# Patient Record
Sex: Female | Born: 1975 | Race: White | Hispanic: No | Marital: Married | Smoking: Former smoker
Health system: Southern US, Community
[De-identification: ages and names within clinical notes are randomized; demographics above are authoritative.]

## PROBLEM LIST (undated history)

## (undated) DIAGNOSIS — K589 Irritable bowel syndrome without diarrhea: Secondary | ICD-10-CM

## (undated) DIAGNOSIS — E785 Hyperlipidemia, unspecified: Secondary | ICD-10-CM

## (undated) DIAGNOSIS — N809 Endometriosis, unspecified: Secondary | ICD-10-CM

## (undated) HISTORY — DX: Hyperlipidemia, unspecified: E78.5

## (undated) HISTORY — DX: Irritable bowel syndrome, unspecified: K58.9

## (undated) HISTORY — PX: INCONTINENCE SURGERY: SHX676

## (undated) HISTORY — PX: ABDOMINAL HYSTERECTOMY: SHX81

---

## 2011-11-16 ENCOUNTER — Emergency Department (INDEPENDENT_AMBULATORY_CARE_PROVIDER_SITE_OTHER)
Admission: EM | Admit: 2011-11-16 | Discharge: 2011-11-16 | Disposition: A | Discharge status: Home / Self Care | Payer: Managed Care, Other (non HMO) | Source: Home / Self Care | Attending: Emergency Medicine | Admitting: Emergency Medicine

## 2011-11-16 ENCOUNTER — Emergency Department
Admit: 2011-11-16 | Discharge: 2011-11-16 | Disposition: A | Discharge status: Home / Self Care | Payer: Managed Care, Other (non HMO) | Attending: Emergency Medicine | Admitting: Emergency Medicine

## 2011-11-16 DIAGNOSIS — M25539 Pain in unspecified wrist: Secondary | ICD-10-CM

## 2011-11-16 DIAGNOSIS — M25531 Pain in right wrist: Secondary | ICD-10-CM

## 2011-11-16 MED ORDER — TRAMADOL HCL 50 MG PO TABS: 50.0000 mg | ORAL_TABLET | Freq: Four times a day (QID) | ORAL | Status: AC | PRN

## 2011-11-16 NOTE — ED Provider Notes (Signed)
History     CSN: [id number]  Arrival date & time 11/16/11  1645   First MD Initiated Contact with Patient 11/16/11 1703      Chief Complaint  Patient presents with  . Wrist Pain    2 weeks    (Consider location/radiation/quality/duration/timing/severity/associated sxs/prior treatment) HPI This is a left-handed female who presents with right wrist pain for the last few weeks. She denies any recent injury or history of injury but states that she has been diagnosed with possible carpal tunnel syndrome in the past. She states that she did fall while ice-skating about 3-4 months ago and may have caught herself with her wrist but does not think that she was injured during this episode. Resting at makes it feel better and she has tried a brace which helps. She's also been trying some over-the-counter ibuprofen which helps slightly. When she tries to manipulate items or pick them up or pull things the pain does get fairly severe and constant. The pain is located on the ulnar aspect of her No past medical history on file.  No past surgical history on file.  Family History  Problem Relation Age of Onset  . Cancer Father     Lung and Kidney  . Heart failure Father     History  Substance Use Topics  . Smoking status: Current Everyday Smoker -- 1.0 packs/day for 10 years  . Smokeless tobacco: Never Used  . Alcohol Use: No    OB History    Grav Para Term Preterm Abortions TAB SAB Ect Mult Living                  Review of Systems  Allergies  Biaxin; Erythromycin; Penicillins; and Zithromax  Home Medications   Current Outpatient Rx  Name Route Sig Dispense Refill  . IBUPROFEN 200 MG PO TABS Oral Take 200 mg by mouth every 6 (six) hours as needed.    Marland Kitchen TRAMADOL HCL 50 MG PO TABS Oral Take 1 tablet (50 mg total) by mouth every 6 (six) hours as needed for pain. 24 tablet 0    BP 111/76  Pulse 94  Temp(Src) 98.1 F (36.7 C) (Oral)  Ht 5' 3.5" (1.613 m)  Wt 126 lb (57.153 kg)   BMI 21.97 kg/m2  SpO2 100%  Physical Exam  Nursing note and vitals reviewed. Constitutional: She is oriented to person, place, and time. She appears well-developed and well-nourished.  HENT:  Head: Normocephalic and atraumatic.  Eyes: No scleral icterus.  Neck: Neck supple.  Cardiovascular: Regular rhythm and normal heart sounds.   Pulmonary/Chest: Effort normal and breath sounds normal. No respiratory distress.  Musculoskeletal:       Right wrist examination demonstrates full range of motion with flexion, extension, supination and pronation. She does have some tenderness to round hypo-thenar eminence as well as in the area of the triangular fibrocartilage complex of the wrist. There is no bruising or swelling. Distal neurovascular status intact. I do not feel any pulsations around the Guyon's canal. The Allen test is negative. The piano key sign does cause some irritation. There is no snuffbox tenderness.  Neurological: She is alert and oriented to person, place, and time.  Skin: Skin is warm and dry.  Psychiatric: She has a normal mood and affect. Her speech is normal.    ED Course  Procedures (including critical care time)  Labs Reviewed - No data to display Dg Wrist Complete Right  11/16/2011  *RADIOLOGY REPORT*  Clinical Data: Ulnar  sided wrist pain.  No known injury.  RIGHT WRIST - COMPLETE 3+ VIEW  Comparison:  None.  Findings:  There is no evidence of fracture or dislocation.  There is no evidence of arthropathy or other focal bone abnormality. Soft tissues are unremarkable.  IMPRESSION: Negative.  Original Report Authenticated By: Danae Orleans, M.D.     1. Right wrist pain       MDM   A. x-ray was ordered and read by the radiologist as above. Despite the read, I am concerned that there may be a very small crack at the triquetrum seen especially on the oblique view. I have let the patient knows that as read normal by the radiology, however it is not improving in the next  7-10 days, then she will need to see a sports medicine doctor who can look further at the x-ray consider advanced imaging. Encourage rest, ice, compression with ACE bandage and/or a brace, and elevation of injured body part.  We have placed her in a wrist splint to allow her to rest her wrist for the next one to 2 weeks. I've also given her prescription for tramadol and advised her to use ice. If she is not improving in the next 7-10 days, I would like her to followup with a sports medicine provider to consider further workup versus MRI versus occupational therapy versus injection.  Lily Kocher, MD 11/16/11 [phone]

## 2011-11-16 NOTE — ED Notes (Signed)
Patient presents with right wrist pain for 2 weeks. She denies recent injury, she did fall ice skating 4-5 months ago and caught herself with her hand. She describes the pain as shooting and tender. Rest makes the pain better. Pain is 10/10 when she pulls or picks up items. She tried otc ibuprofen with little relief.

## 2012-06-29 DIAGNOSIS — R509 Fever, unspecified: Secondary | ICD-10-CM | POA: Insufficient documentation

## 2012-06-29 DIAGNOSIS — J159 Unspecified bacterial pneumonia: Secondary | ICD-10-CM | POA: Insufficient documentation

## 2013-01-21 IMAGING — CR DG WRIST COMPLETE 3+V*R*
4 series · 4 of 4 positions shown · non-contrast
Comparison: None.

CLINICAL DATA: Ulnar sided wrist pain.  No known injury.

RIGHT WRIST - COMPLETE 3+ VIEW

[view not recorded (1 of 4)]
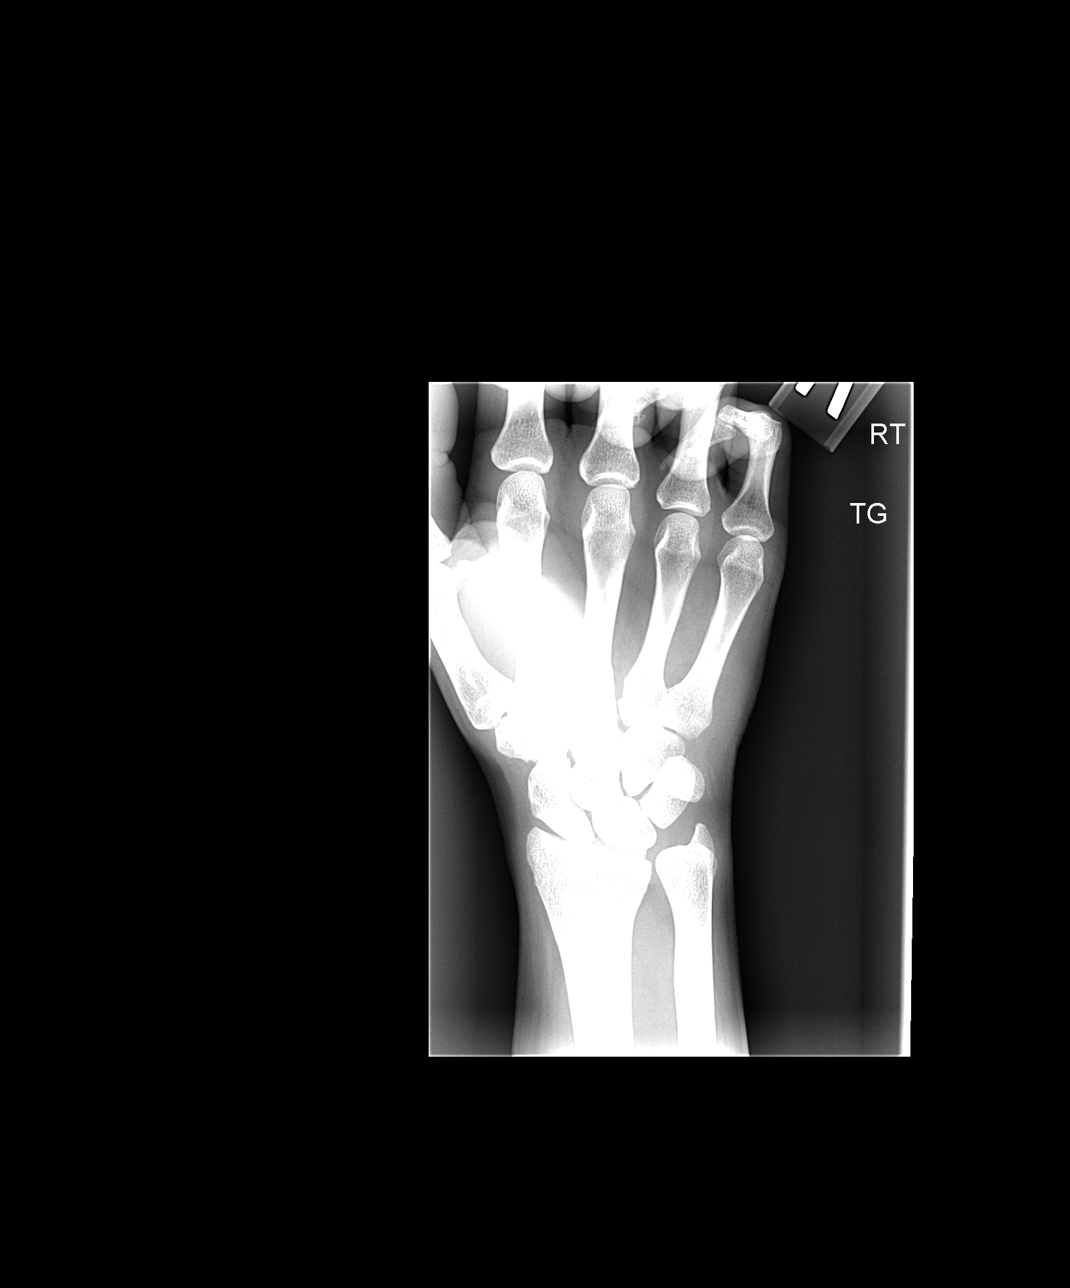

[view not recorded (2 of 4)]
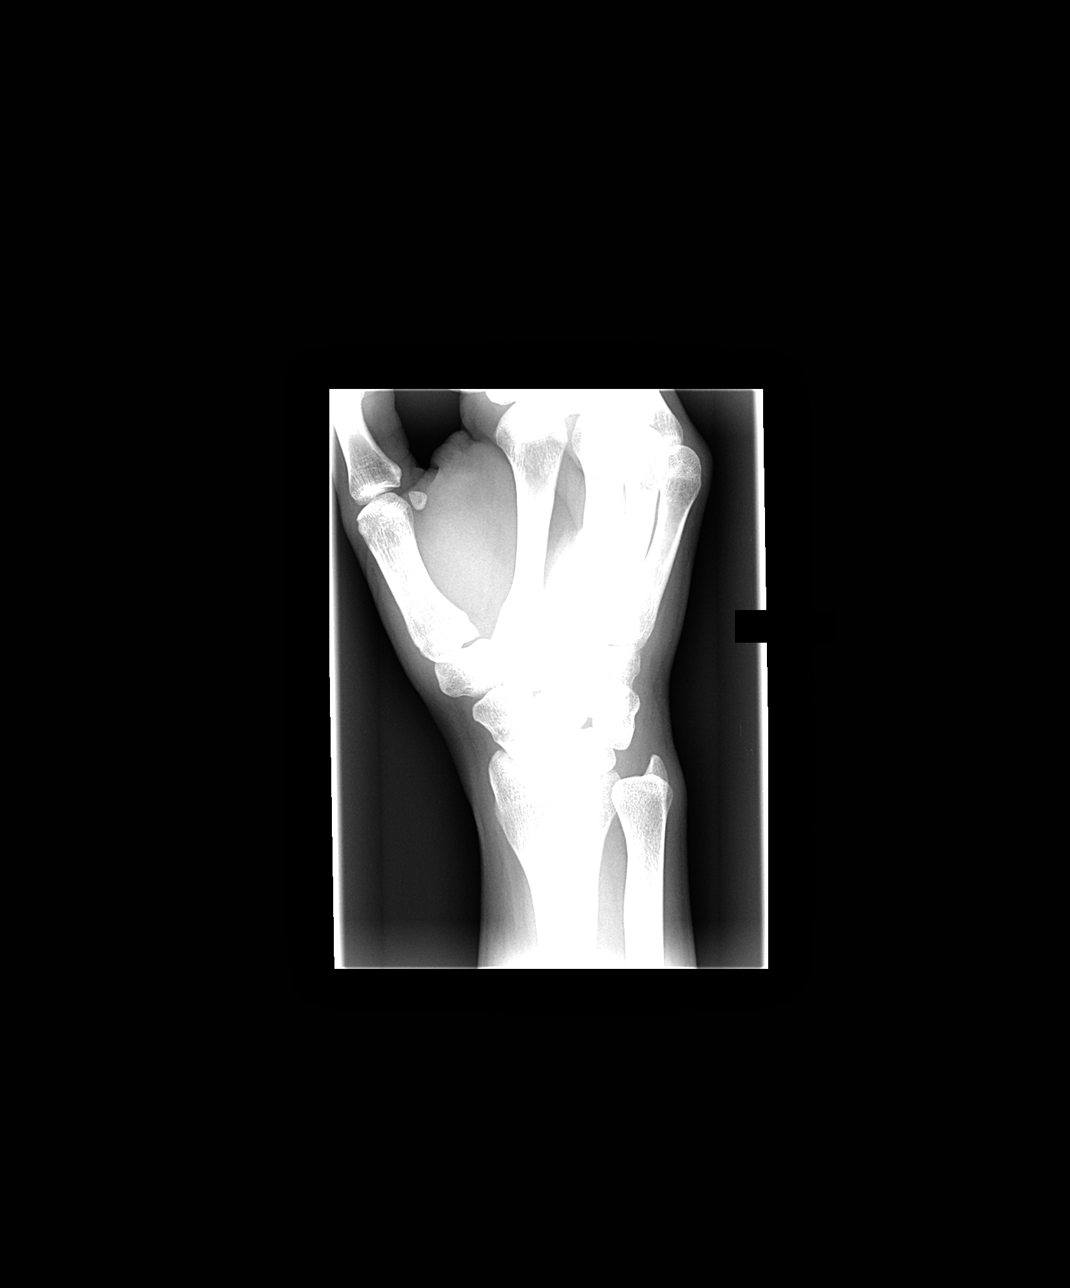

[view not recorded (3 of 4)]
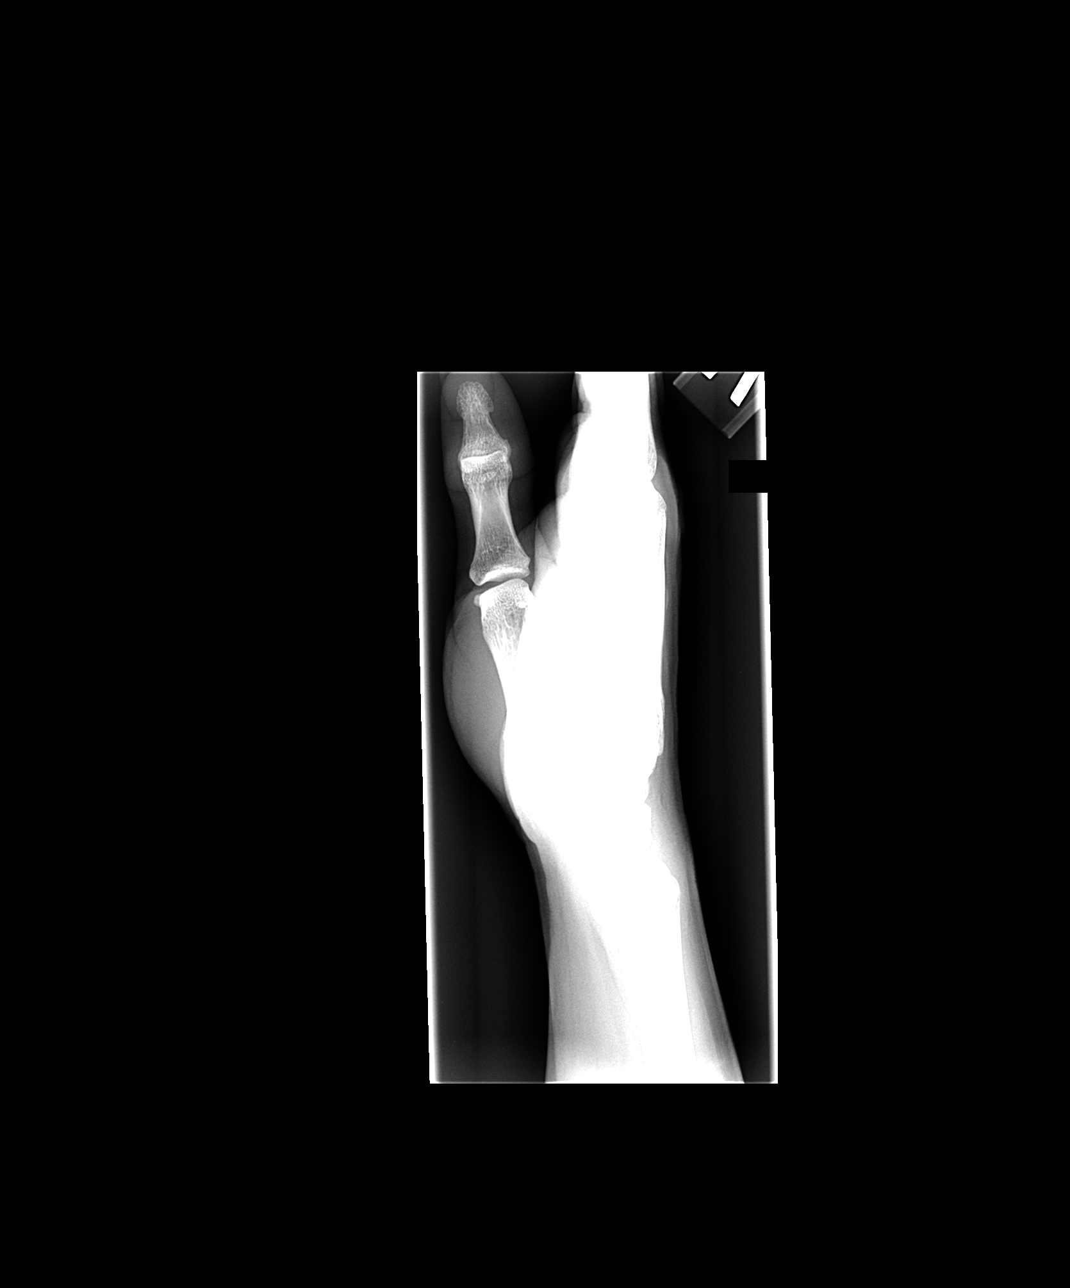

[view not recorded (4 of 4)]
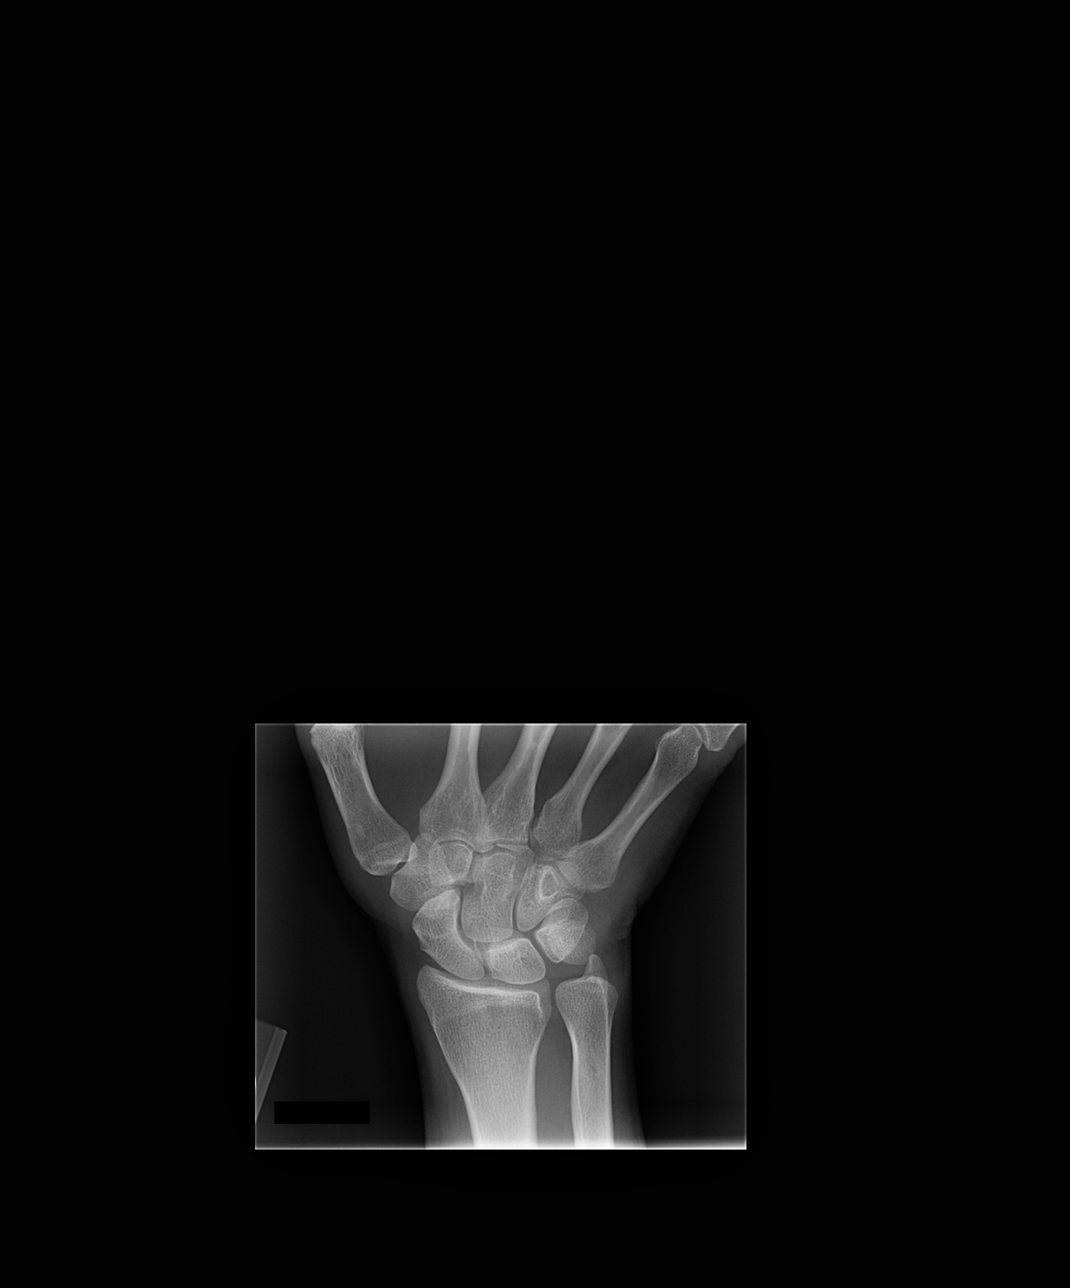

[4 of 4 positions shown; findings below may reference images not displayed]

FINDINGS: There is no evidence of fracture or dislocation.  There
is no evidence of arthropathy or other focal bone abnormality.
Soft tissues are unremarkable.
IMPRESSION: Negative.

## 2014-10-24 ENCOUNTER — Emergency Department (INDEPENDENT_AMBULATORY_CARE_PROVIDER_SITE_OTHER)
Admission: EM | Admit: 2014-10-24 | Discharge: 2014-10-24 | Disposition: A | Discharge status: Home / Self Care | Payer: Managed Care, Other (non HMO) | Source: Home / Self Care | Attending: Family Medicine | Admitting: Family Medicine

## 2014-10-24 ENCOUNTER — Encounter: Payer: Self-pay | Admitting: *Deleted

## 2014-10-24 DIAGNOSIS — R5383 Other fatigue: Secondary | ICD-10-CM

## 2014-10-24 NOTE — ED Provider Notes (Signed)
Diamond Ellis is a 39 y.o. female who presents to Urgent Care today for evaluation of thyroid. Over the past several years patient has had some unintentional weight loss dry skin and brittle hair puffy eyes and feeling cold all the time. She is concerned that she has thyroid disease. She would like to start the workup for the above symptoms and establish with a primary care provider. She feels well otherwise. No vomiting diarrhea chest pain or palpitations or shortness of breath.   History reviewed. No pertinent past medical history. History reviewed. No pertinent past surgical history. History  Substance Use Topics  . Smoking status: Current Every Day Smoker -- 1.00 packs/day for 10 years  . Smokeless tobacco: Never Used  . Alcohol Use: No   ROS as above Medications: No current facility-administered medications for this encounter.   No current outpatient prescriptions on file.   Allergies  Allergen Reactions  . Biaxin [Clarithromycin]   . Clarithromycin   . Erythromycin   . Penicillins   . Zithromax [Azithromycin]      Exam:  BP 108/76 mmHg  Pulse 91  Temp(Src) 98.4 F (36.9 C) (Oral)  Resp 14  Wt 115 lb (52.164 kg)  SpO2 97% Gen: Well NAD HEENT: EOMI,  MMM mildly puffy lower eyelids bilaterally. No significant Exophthalmos. Thyroid is small to palpation without significant nodules. Lungs: Normal work of breathing. CTABL Heart: RRR no MRG Abd: NABS, Soft. Nondistended, Nontender Exts: Brisk capillary refill, warm and well perfused.   No results found for this or any previous visit (from the past 24 hour(s)). No results found.  Assessment and Plan: 39 y.o. female with fatigue. We'll test TSH, free T4, CMP, CBC. Follow up with primary care provider.  Discussed warning signs or symptoms. Please see discharge instructions. Patient expresses understanding.     Gregor Hams, MD 10/24/14 1409

## 2014-10-24 NOTE — Discharge Instructions (Signed)
Thank you for coming in today. We will call with results.  Follow up with a primary doctor.   Hypothyroidism The thyroid is a large gland located in the lower front of your neck. The thyroid gland helps control metabolism. Metabolism is how your body handles food. It controls metabolism with the hormone thyroxine. When this gland is underactive (hypothyroid), it produces too little hormone.  CAUSES These include:   Absence or destruction of thyroid tissue.  Goiter due to iodine deficiency.  Goiter due to medications.  Congenital defects (since birth).  Problems with the pituitary. This causes a lack of TSH (thyroid stimulating hormone). This hormone tells the thyroid to turn out more hormone. SYMPTOMS  Lethargy (feeling as though you have no energy)  Cold intolerance  Weight gain (in spite of normal food intake)  Dry skin  Coarse hair  Menstrual irregularity (if severe, may lead to infertility)  Slowing of thought processes Cardiac problems are also caused by insufficient amounts of thyroid hormone. Hypothyroidism in the newborn is cretinism, and is an extreme form. It is important that this form be treated adequately and immediately or it will lead rapidly to retarded physical and mental development. DIAGNOSIS  To prove hypothyroidism, your caregiver may do blood tests and ultrasound tests. Sometimes the signs are hidden. It may be necessary for your caregiver to watch this illness with blood tests either before or after diagnosis and treatment. TREATMENT  Low levels of thyroid hormone are increased by using synthetic thyroid hormone. This is a safe, effective treatment. It usually takes about four weeks to gain the full effects of the medication. After you have the full effect of the medication, it will generally take another four weeks for problems to leave. Your caregiver may start you on low doses. If you have had heart problems the dose may be gradually increased. It is  generally not an emergency to get rapidly to normal. HOME CARE INSTRUCTIONS   Take your medications as your caregiver suggests. Let your caregiver know of any medications you are taking or start taking. Your caregiver will help you with dosage schedules.  As your condition improves, your dosage needs may increase. It will be necessary to have continuing blood tests as suggested by your caregiver.  Report all suspected medication side effects to your caregiver. SEEK MEDICAL CARE IF: Seek medical care if you develop:  Sweating.  Tremulousness (tremors).  Anxiety.  Rapid weight loss.  Heat intolerance.  Emotional swings.  Diarrhea.  Weakness. SEEK IMMEDIATE MEDICAL CARE IF:  You develop chest pain, an irregular heart beat (palpitations), or a rapid heart beat. MAKE SURE YOU:   Understand these instructions.  Will watch your condition.  Will get help right away if you are not doing well or get worse. Document Released: 09/13/2005 Document Revised: 12/06/2011 Document Reviewed: 05/03/2008 [facility] Patient Information 2015 [place], [place]. This information is not intended to replace advice given to you by your health care provider. Make sure you discuss any questions you have with your health care provider.

## 2014-10-24 NOTE — ED Notes (Addendum)
Pt c/o HA, weight loss, fatigue and eye puffiness x 3 years. She is concerned about her thyroid and is in need of a PCP.

## 2014-10-25 ENCOUNTER — Telehealth: Payer: Self-pay | Admitting: *Deleted

## 2014-10-25 LAB — CBC
HEMATOCRIT: 41.6 % (ref 36.0–46.0)
Hemoglobin: 14.2 g/dL (ref 12.0–15.0)
MCH: 31.4 pg (ref 26.0–34.0)
MCHC: 34.1 g/dL (ref 30.0–36.0)
MCV: 92 fL (ref 78.0–100.0)
MPV: 9.3 fL (ref 8.6–12.4)
Platelets: 283 10*3/uL (ref 150–400)
RBC: 4.52 MIL/uL (ref 3.87–5.11)
RDW: 13.3 % (ref 11.5–15.5)
WBC: 6.7 10*3/uL (ref 4.0–10.5)

## 2014-10-25 LAB — COMPLETE METABOLIC PANEL WITH GFR
ALBUMIN: 4 g/dL (ref 3.5–5.2)
ALK PHOS: 58 U/L (ref 39–117)
ALT: 9 U/L (ref 0–35)
AST: 11 U/L (ref 0–37)
BILIRUBIN TOTAL: 0.7 mg/dL (ref 0.2–1.2)
BUN: 8 mg/dL (ref 6–23)
CALCIUM: 9.1 mg/dL (ref 8.4–10.5)
CO2: 27 meq/L (ref 19–32)
CREATININE: 0.74 mg/dL (ref 0.50–1.10)
Chloride: 106 mEq/L (ref 96–112)
GFR, Est Non African American: >89 mL/min
Glucose, Bld: 94 mg/dL (ref 70–99)
POTASSIUM: 4.2 meq/L (ref 3.5–5.3)
SODIUM: 140 meq/L (ref 135–145)
TOTAL PROTEIN: 6.4 g/dL (ref 6.0–8.3)

## 2014-10-25 LAB — T4, FREE: FREE T4: 1.53 ng/dL (ref 0.80–1.80)

## 2014-10-25 LAB — TSH: TSH: 0.622 u[IU]/mL (ref 0.350–4.500)

## 2014-10-30 ENCOUNTER — Ambulatory Visit (INDEPENDENT_AMBULATORY_CARE_PROVIDER_SITE_OTHER): Payer: Managed Care, Other (non HMO) | Admitting: Physician Assistant

## 2014-10-30 ENCOUNTER — Encounter: Payer: Self-pay | Admitting: Physician Assistant

## 2014-10-30 VITALS — BP 102/69 | HR 85 | Ht 64.0 in | Wt 112.0 lb

## 2014-10-30 DIAGNOSIS — R634 Abnormal weight loss: Secondary | ICD-10-CM

## 2014-10-30 DIAGNOSIS — G4700 Insomnia, unspecified: Secondary | ICD-10-CM

## 2014-10-30 DIAGNOSIS — R002 Palpitations: Secondary | ICD-10-CM | POA: Insufficient documentation

## 2014-10-30 DIAGNOSIS — F411 Generalized anxiety disorder: Secondary | ICD-10-CM | POA: Insufficient documentation

## 2014-10-30 DIAGNOSIS — G2581 Restless legs syndrome: Secondary | ICD-10-CM

## 2014-10-30 DIAGNOSIS — L853 Xerosis cutis: Secondary | ICD-10-CM | POA: Insufficient documentation

## 2014-10-30 MED ORDER — BUSPIRONE HCL 7.5 MG PO TABS: 7.5000 mg | ORAL_TABLET | Freq: Three times a day (TID) | ORAL | Status: DC

## 2014-10-30 MED ORDER — CITALOPRAM HYDROBROMIDE 10 MG PO TABS: 10.0000 mg | ORAL_TABLET | Freq: Every day | ORAL | Status: DC

## 2014-10-30 NOTE — Patient Instructions (Signed)
Premature Ventricular Contraction Premature ventricular contraction (PVC) is an irregularity of the heart rhythm involving extra or skipped heartbeats. In some cases, they may occur without obvious cause or heart disease. Other times, they can be caused by an electrolyte change in the blood. These need to be corrected. They can also be seen when there is not enough oxygen going to the heart. A common cause of this is plaque or cholesterol buildup. This buildup decreases the blood supply to the heart. In addition, extra beats may be caused or aggravated by:  Excessive smoking.  Alcohol consumption.  Caffeine.  Certain medications  Some street drugs. SYMPTOMS   The sensation of feeling your heart skipping a beat (palpitations).  In many cases, the person may have no symptoms. SIGNS AND TESTS   A physical examination may show an occasional irregularity, but if the PVC beats do not happen often, they may not be found on physical exam.  Blood pressure is usually normal.  Other tests that may find extra beats of the heart are:  An EKG (electrocardiogram)  A Holter monitor which can monitor your heart over longer periods of time  An Angiogram (study of the heart arteries). TREATMENT  Usually extra heartbeats do not need treatment. The condition is treated only if symptoms are severe or if extra beats are very frequent or are causing problems. An underlying cause, if discovered, may also require treatment.  Treatment may also be needed if there may be a risk for other more serious cardiac arrhythmias.  PREVENTION   Moderation in caffeine, alcohol, and tobacco use may reduce the risk of ectopic heartbeats in some people.  Exercise often helps people who lead a sedentary (inactive) lifestyle. PROGNOSIS  PVC heartbeats are generally harmless and do not need treatment.  RISKS AND COMPLICATIONS   Ventricular tachycardia (occasionally).  There usually are no complications.  Other  arrhythmias (occasionally). SEEK IMMEDIATE MEDICAL CARE IF:   You feel palpitations that are frequent or continual.  You develop chest pain or other problems such as shortness of breath, sweating, or nausea and vomiting.  You become light-headed or faint (pass out).  You get worse or do not improve with treatment. Document Released: 04/30/2004 Document Revised: 12/06/2011 Document Reviewed: 11/10/2007 [facility] Patient Information 2015 [place], [place]. This information is not intended to replace advice given to you by your health care provider. Make sure you discuss any questions you have with your health care provider. Fatigue Fatigue is a feeling of tiredness, lack of energy, lack of motivation, or feeling tired all the time. Having enough rest, good nutrition, and reducing stress will normally reduce fatigue. Consult your caregiver if it persists. The nature of your fatigue will help your caregiver to find out its cause. The treatment is based on the cause.  CAUSES  There are many causes for fatigue. Most of the time, fatigue can be traced to one or more of your habits or routines. Most causes fit into one or more of three general areas. They are: Lifestyle problems  Sleep disturbances.  Overwork.  Physical exertion.  Unhealthy habits.  Poor eating habits or eating disorders.  Alcohol and/or drug use .  Lack of proper nutrition (malnutrition). Psychological problems  Stress and/or anxiety problems.  Depression.  Grief.  Boredom. Medical Problems or Conditions  Anemia.  Pregnancy.  Thyroid gland problems.  Recovery from major surgery.  Continuous pain.  Emphysema or asthma that is not well controlled  Allergic conditions.  Diabetes.  Infections (such as  mononucleosis).  Obesity.  Sleep disorders, such as sleep apnea.  Heart failure or other heart-related problems.  Cancer.  Kidney disease.  Liver disease.  Effects of certain medicines such as  antihistamines, cough and cold remedies, prescription pain medicines, heart and blood pressure medicines, drugs used for treatment of cancer, and some antidepressants. SYMPTOMS  The symptoms of fatigue include:   Lack of energy.  Lack of drive (motivation).  Drowsiness.  Feeling of indifference to the surroundings. DIAGNOSIS  The details of how you feel help guide your caregiver in finding out what is causing the fatigue. You will be asked about your present and past health condition. It is important to review all medicines that you take, including prescription and non-prescription items. A thorough exam will be done. You will be questioned about your feelings, habits, and normal lifestyle. Your caregiver may suggest blood tests, urine tests, or other tests to look for common medical causes of fatigue.  TREATMENT  Fatigue is treated by correcting the underlying cause. For example, if you have continuous pain or depression, treating these causes will improve how you feel. Similarly, adjusting the dose of certain medicines will help in reducing fatigue.  HOME CARE INSTRUCTIONS   Try to get the required amount of good sleep every night.  Eat a healthy and nutritious diet, and drink enough water throughout the day.  Practice ways of relaxing (including yoga or meditation).  Exercise regularly.  Make plans to change situations that cause stress. Act on those plans so that stresses decrease over time. Keep your work and personal routine reasonable.  Avoid street drugs and minimize use of alcohol.  Start taking a daily multivitamin after consulting your caregiver. SEEK MEDICAL CARE IF:   You have persistent tiredness, which cannot be accounted for.  You have fever.  You have unintentional weight loss.  You have headaches.  You have disturbed sleep throughout the night.  You are feeling sad.  You have constipation.  You have dry skin.  You have gained weight.  You are taking  any new or different medicines that you suspect are causing fatigue.  You are unable to sleep at night.  You develop any unusual swelling of your legs or other parts of your body. SEEK IMMEDIATE MEDICAL CARE IF:   You are feeling confused.  Your vision is blurred.  You feel faint or pass out.  You develop severe headache.  You develop severe abdominal, pelvic, or back pain.  You develop chest pain, shortness of breath, or an irregular or fast heartbeat.  You are unable to pass a normal amount of urine.  You develop abnormal bleeding such as bleeding from the rectum or you vomit blood.  You have thoughts about harming yourself or committing suicide.  You are worried that you might harm someone else. MAKE SURE YOU:   Understand these instructions.  Will watch your condition.  Will get help right away if you are not doing well or get worse. Document Released: 07/11/2007 Document Revised: 12/06/2011 Document Reviewed: 01/15/2014 [facility] Patient Information 2015 [place], [place]. This information is not intended to replace advice given to you by your health care provider. Make sure you discuss any questions you have with your health care provider.

## 2014-11-04 NOTE — Progress Notes (Signed)
   Subjective:    Patient ID: Diamond Ellis, female    DOB: 27-Aug-1976, 39 y.o.   MRN: [id number]  HPI  Patient is a 39 year old female who presents to the clinic to establish care.  .. Active Ambulatory Problems    Diagnosis Date Noted  . RLS (restless legs syndrome) 10/30/2014  . Dry skin 10/30/2014  . Loss of weight 10/30/2014  . Insomnia 10/30/2014  . Palpitations 10/30/2014  . Generalized anxiety disorder 10/30/2014   Resolved Ambulatory Problems    Diagnosis Date Noted  . No Resolved Ambulatory Problems   No Additional Past Medical History   .Marland Kitchen Family History  Problem Relation Age of Onset  . Cancer Father     Lung and Kidney  . Heart failure Father   . Diabetes Father   . Alcohol abuse Father   . Hyperlipidemia Father   . Stroke Father    .Marland Kitchen History   Social History  . Marital Status: Divorced    Spouse Name: N/A    Number of Children: N/A  . Years of Education: N/A   Occupational History  . Not on file.   Social History Main Topics  . Smoking status: Current Every Day Smoker -- 1.00 packs/day for 10 years  . Smokeless tobacco: Never Used  . Alcohol Use: No  . Drug Use: No  . Sexual Activity: Not on file   Other Topics Concern  . Not on file   Social History Narrative   Pt has a lot going on with many different symptoms that she thought was thyroid related. She went to UC and thyroid was normal. Now she just doesn't know. She has a lot of stress that has not been treated. She is always anxious but now feels like more symptoms are occuring. She is having problems sleeping at night. Her heart flutters some. She is losing weight without trying. Her skin is very dry. She has some leg jumping at night. She has lost from 130 to 112 over past 6 months. She has had a job change. Denies any caffiene or alcohol. Tried melatonin in past but only 3mg .   Review of Systems  All other systems reviewed and are negative.      Objective:   Physical Exam   Constitutional: She is oriented to person, place, and time. She appears well-developed and well-nourished.  HENT:  Head: Normocephalic and atraumatic.  Cardiovascular: Normal rate, regular rhythm and normal heart sounds.   Pulmonary/Chest: Effort normal and breath sounds normal.  Neurological: She is alert and oriented to person, place, and time.  Skin: Skin is dry.  Psychiatric: She has a normal mood and affect. Her behavior is normal.          Assessment & Plan:  GAD/insomnia-GAD-7 was17.started celexa and buspar. Discussed side effects. Follow up in 4-6 weeks. For insomnia try melatonin up to 10mg  at bedtime. If no improvement will start to investigate more. If palpations do not improve will consider EKG. Discussed potential triggers.   Dry skin/loss of weight/RLS- will check b12, vit d and ferritin. i do think some of these symptoms could be linked to her anxiety.

## 2015-11-13 ENCOUNTER — Encounter: Payer: Self-pay | Admitting: *Deleted

## 2015-11-13 ENCOUNTER — Emergency Department (INDEPENDENT_AMBULATORY_CARE_PROVIDER_SITE_OTHER)
Admission: EM | Admit: 2015-11-13 | Discharge: 2015-11-13 | Disposition: A | Discharge status: Home / Self Care | Payer: Managed Care, Other (non HMO) | Source: Home / Self Care | Attending: Family Medicine | Admitting: Family Medicine

## 2015-11-13 DIAGNOSIS — N898 Other specified noninflammatory disorders of vagina: Secondary | ICD-10-CM

## 2015-11-13 DIAGNOSIS — N750 Cyst of Bartholin's gland: Secondary | ICD-10-CM

## 2015-11-13 MED ORDER — SULFAMETHOXAZOLE-TRIMETHOPRIM 800-160 MG PO TABS: 1.0000 | ORAL_TABLET | Freq: Two times a day (BID) | ORAL | Status: AC

## 2015-11-13 NOTE — ED Notes (Signed)
Pt reports a severe skin sensitivity history. She was recently out of town and after putting lotion on her face she went to the bathroom and wiped, using their toilet paper, almost immediately developed burning rash to her labia. She reports it is improved but still present.

## 2015-11-13 NOTE — ED Provider Notes (Signed)
CSN: [id number]     Arrival date & time 11/13/15  1428 History   First MD Initiated Contact with Patient 11/13/15 1502     Chief Complaint  Patient presents with  . Rash   (Consider location/radiation/quality/duration/timing/severity/associated sxs/prior Treatment) HPI  The pt is a 40yo female presenting to [facility] with c/o vaginal irritation and rash that started about 4 days ago. She notes she has severely sensitive skin in vaginal area and had been out of town staying at a hotel.  The other day she was using prescription face cream, then went to the bathroom and wipe her vaginal area with the hotel's rough toilet paper. She immediately felt a burning sensation in her vaginal area.  She does not recall name of the face cream but it advised Not to use in vaginal area.  She notes the skin pealed on in layers and seems to have healed some but there are still 3 small bumps that area painful for the pt. She also reports hx of bartholin cyst that required I&D in the past but states this pain is not as severe. Denies dysuria, hematuria, lower abdominal pain, fever, chills, n/v/d. Denies concern for STDs as she is in a monogamous marriage. Denies vaginal discharge or bleeding.  History reviewed. No pertinent past medical history. History reviewed. No pertinent past surgical history. Family History  Problem Relation Age of Onset  . Cancer Father     Lung and Kidney  . Heart failure Father   . Diabetes Father   . Alcohol abuse Father   . Hyperlipidemia Father   . Stroke Father    Social History  Substance Use Topics  . Smoking status: Current Every Day Smoker -- 1.00 packs/day for 10 years  . Smokeless tobacco: Never Used  . Alcohol Use: No   OB History    No data available     Review of Systems  Constitutional: Negative for fever and chills.  Gastrointestinal: Negative for nausea, vomiting, abdominal pain and diarrhea.  Genitourinary: Positive for vaginal pain. Negative for dysuria, urgency,  frequency, hematuria, flank pain, vaginal bleeding, vaginal discharge and pelvic pain.  Musculoskeletal: Negative for myalgias and back pain.  Skin: Positive for rash. Negative for color change and wound.    Allergies  Biaxin; Clarithromycin; Erythromycin; Penicillins; and Zithromax  Home Medications   Prior to Admission medications   Medication Sig Start Date End Date Taking? Authorizing Provider  busPIRone (BUSPAR) 7.5 MG tablet Take 1 tablet (7.5 mg total) by mouth 3 (three) times daily. 10/30/14   Jade L Breeback, PA-C  citalopram (CELEXA) 10 MG tablet Take 1 tablet (10 mg total) by mouth daily. 10/30/14   Jade L Breeback, PA-C  sulfamethoxazole-trimethoprim (BACTRIM DS,SEPTRA DS) 800-160 MG tablet Take 1 tablet by mouth 2 (two) times daily. 11/13/15 11/20/15  Noland Fordyce, PA-C   Meds Ordered and Administered this Visit  Medications - No data to display  BP 119/84 mmHg  Pulse 96  Temp(Src) 98.3 F (36.8 C) (Oral)  Wt 123 lb (55.792 kg)  SpO2 99% No data found.   Physical Exam  Constitutional: She is oriented to person, place, and time. She appears well-developed and well-nourished.  HENT:  Head: Normocephalic and atraumatic.  Eyes: EOM are normal.  Neck: Normal range of motion.  Cardiovascular: Normal rate.   Pulmonary/Chest: Effort normal.  Genitourinary: There is rash, tenderness and lesion on the right labia. There is rash and tenderness on the left labia. There is no lesion on the left  labia. There is erythema and tenderness in the vagina. No bleeding in the vagina. No vaginal discharge found.  Chaperoned external pelvic exam.  3 pinpoint skin colored lesions on Left labia majora. Mildly tender. Pea-sized erythematous tender bartholin cyst. No bleeding or discharge. No vesicular type lesions.  No evidence of chemical burns.  Musculoskeletal: Normal range of motion.  Neurological: She is alert and oriented to person, place, and time.  Skin: Skin is warm and dry.   Psychiatric: She has a normal mood and affect. Her behavior is normal.  Nursing note and vitals reviewed.   ED Course  Procedures (including critical care time)  Labs Review Labs Reviewed - No data to display  Imaging Review No results found.    MDM   1. Vaginal irritation   2. Bartholin cyst    Pt c/o vaginal irritation after accidentally wiping her vaginal area soon after applying a prescription facial cream that should not be used in the vaginal area. Hx of severely sensitive skin in vaginal region. Exam c/w mildly inflamed bartholin cyst. No bleeding or discharge. Discussed conservative treatment with sitz baths, warm compresses and PO antibiotics. Pt would like to try conservative treatment. If pain and irritation does not improve in 4-5 days or if swelling of bartholin cyst worsens, advised to f/u with her OB/GYN as I&D may be indicated at that time. Patient verbalized understanding and agreement with treatment plan.      Noland Fordyce, PA-C 11/13/15 1650

## 2015-11-13 NOTE — Discharge Instructions (Signed)
Bartholin Cyst or Abscess A Bartholin cyst is a fluid-filled sac that forms on a Bartholin gland. Bartholin glands are small glands that are located within the folds of skin (labia) along the sides of the lower opening of the vagina. These glands produce a fluid to moisten the outside of the vagina during sexual intercourse. A Bartholin cyst causes a bulge on the side of the vagina. A cyst that is not large or infected may not cause symptoms or problems. However, if the fluid within the cyst becomes infected, the cyst can turn into an abscess. An abscess may cause discomfort or pain. CAUSES A Bartholin cyst may develop when the duct of the gland becomes blocked. In many cases, the cause of this is not known. Various kinds of bacteria can cause the cyst to become infected and develop into an abscess. RISK FACTORS You may be at an increased risk of developing a Bartholin cyst or abscess if:  You are a woman of reproductive age.  You have a history of previous Bartholin cysts or abscesses.  You have diabetes.  You have a sexually transmitted disease (STD). SIGNS AND SYMPTOMS The severity of symptoms varies depending on the size of the cyst and whether it is infected. Symptoms may include:  A bulge or swelling near the lower opening of your vagina.  Discomfort or pain.  Redness.  Pain during sexual intercourse.  Pain when walking.  Fluid draining from the area. DIAGNOSIS Your health care provider may make a diagnosis based on your symptoms and a physical exam. He or she will look for swelling in your vaginal area. Blood tests may be done to check for infections. A sample of fluid from the cyst or abscess may also be taken to be tested in a lab. TREATMENT Small cysts that are not infected may not require any treatment. These often go away on their own. Yourhealth care provider will recommend hot baths and the use of warm compresses. These may also be part of the treatment for an abscess.  Treatment options for a large cyst or abscess may include:   Antibiotic medicine.  A surgical procedure to drain the abscess. One of the following procedures may be done:  Incision and drainage. An incision is made in the cyst or abscess so that the fluid drains out. A catheter may be placed inside the cyst so that it does not close and fill up with fluid again. The catheter will be removed after you have a follow-up visit with a specialist (gynecologist).  Marsupialization. The cyst or abscess is opened and kept open by stitching the edges of the skin to the walls of the cyst or abscess. This allows it to continue to drain and not fill up with fluid again. If you have cysts or abscesses that keep returning and have required incision and drainage multiple times, your health care provider may talk to you about surgery to remove the Bartholin gland. HOME CARE INSTRUCTIONS  Take medicines only as directed by your health care provider.  If you were prescribed an antibiotic medicine, finish it all even if you start to feel better.  Apply warm, wet compresses to the area or take warm, shallow baths that cover your pelvic region (sitz baths) several times a day or as directed by your health care provider.  Do not squeeze the cyst or apply heavy pressure to it.  Do not have sexual intercourse until the cyst has gone away.  If your cyst or abscess was   opened, a small piece of gauze or a drain may have been placed in the area to allow drainage. Do not remove the gauze or the drain until directed by your health care provider.  Wear feminine pads--not tampons--as needed for any drainage or bleeding.  Keep all follow-up visits as directed by your health care provider. This is important. PREVENTION Take these steps to help prevent a Bartholin cyst from returning:  Practice good hygiene.   Clean your vaginal area with mild soap and a soft cloth when you bathe.  Practice safe sex to prevent  STDs. SEEK MEDICAL CARE IF:  You have increased pain, swelling, or redness in the area of the cyst.  Puslike drainage is coming from the cyst.  You have a fever.   This information is not intended to replace advice given to you by your health care provider. Make sure you discuss any questions you have with your health care provider.   Document Released: 09/13/2005 Document Revised: 10/04/2014 Document Reviewed: 04/29/2014 Elsevier Interactive Patient Education 2016 Elsevier Inc.  

## 2016-11-22 ENCOUNTER — Emergency Department (INDEPENDENT_AMBULATORY_CARE_PROVIDER_SITE_OTHER)
Admission: EM | Admit: 2016-11-22 | Discharge: 2016-11-22 | Disposition: A | Discharge status: Home / Self Care | Payer: Managed Care, Other (non HMO) | Source: Home / Self Care | Attending: Family Medicine | Admitting: Family Medicine

## 2016-11-22 ENCOUNTER — Encounter: Payer: Self-pay | Admitting: Emergency Medicine

## 2016-11-22 ENCOUNTER — Emergency Department (INDEPENDENT_AMBULATORY_CARE_PROVIDER_SITE_OTHER): Payer: Managed Care, Other (non HMO)

## 2016-11-22 DIAGNOSIS — R079 Chest pain, unspecified: Secondary | ICD-10-CM

## 2016-11-22 DIAGNOSIS — R69 Illness, unspecified: Secondary | ICD-10-CM | POA: Diagnosis not present

## 2016-11-22 DIAGNOSIS — J111 Influenza due to unidentified influenza virus with other respiratory manifestations: Secondary | ICD-10-CM

## 2016-11-22 DIAGNOSIS — R0602 Shortness of breath: Secondary | ICD-10-CM

## 2016-11-22 MED ORDER — OSELTAMIVIR PHOSPHATE 75 MG PO CAPS: 75.0000 mg | ORAL_CAPSULE | Freq: Two times a day (BID) | ORAL | 0 refills | Status: DC

## 2016-11-22 MED ORDER — BENZONATATE 200 MG PO CAPS: ORAL_CAPSULE | ORAL | 0 refills | Status: DC

## 2016-11-22 MED ORDER — ACETAMINOPHEN 325 MG PO TABS
975.0000 mg | ORAL_TABLET | Freq: Once | ORAL | Status: AC
  Administered 2016-11-22: 975 mg via ORAL

## 2016-11-22 MED ORDER — DOXYCYCLINE HYCLATE 100 MG PO CAPS: 100.0000 mg | ORAL_CAPSULE | Freq: Two times a day (BID) | ORAL | 0 refills | Status: DC

## 2016-11-22 NOTE — ED Triage Notes (Signed)
Patient started feeling ill yesterday with fever, chills, and congestion. Took ibuprofen at 1:30pm.

## 2016-11-22 NOTE — Discharge Instructions (Signed)
Take plain guaifenesin (1200mg  extended release tabs such as Mucinex) twice daily, with plenty of water, for cough and congestion.  May add Pseudoephedrine (30mg , one or two every 4 to 6 hours) for sinus congestion.  Get adequate rest.   Try warm salt water gargles for sore throat.  Stop all antihistamines for now, and other non-prescription cough/cold preparations. May take Ibuprofen 200mg , 4 tabs every 8 hours with food for headache, fever, etc.

## 2016-11-22 NOTE — ED Provider Notes (Signed)
[facility]    CSN: [id number] Arrival date & time: 11/22/16  1830     History   Chief Complaint Chief Complaint  Patient presents with  . Fever  . Nasal Congestion  . Chills    HPI Diamond Ellis is a 41 y.o. female.   Complains of 1 day history flu-like illness including myalgias, headache, fever/chills, fatigue, and cough.  Also has mild nasal congestion and sore throat.  Cough is non-productive.  No pleuritic pain or shortness of breath, but feels tight in anterior chest.  She has had pneumonia in the past, and states she feels like then.  She continues to smoke.   The history is provided by the patient.    History reviewed. No pertinent past medical history.  Patient Active Problem List   Diagnosis Date Noted  . RLS (restless legs syndrome) 10/30/2014  . Dry skin 10/30/2014  . Loss of weight 10/30/2014  . Insomnia 10/30/2014  . Palpitations 10/30/2014  . Generalized anxiety disorder 10/30/2014    History reviewed. No pertinent surgical history.  OB History    No data available       Home Medications    Prior to Admission medications   Medication Sig Start Date End Date Taking? Authorizing Provider  benzonatate (TESSALON) 200 MG capsule Take one cap by mouth at bedtime as needed for cough.  May repeat in 4 to 6 hours 11/22/16   Kandra Nicolas, MD  busPIRone (BUSPAR) 7.5 MG tablet Take 1 tablet (7.5 mg total) by mouth 3 (three) times daily. 10/30/14   Jade L Breeback, PA-C  citalopram (CELEXA) 10 MG tablet Take 1 tablet (10 mg total) by mouth daily. 10/30/14   Jade L Breeback, PA-C  doxycycline (VIBRAMYCIN) 100 MG capsule Take 1 capsule (100 mg total) by mouth 2 (two) times daily. Take with food. 11/22/16   Kandra Nicolas, MD  oseltamivir (TAMIFLU) 75 MG capsule Take 1 capsule (75 mg total) by mouth every 12 (twelve) hours. 11/22/16   Kandra Nicolas, MD    Family History Family History  Problem Relation Age of Onset  . Cancer Father     Lung and  Kidney  . Heart failure Father   . Diabetes Father   . Alcohol abuse Father   . Hyperlipidemia Father   . Stroke Father     Social History Social History  Substance Use Topics  . Smoking status: Current Every Day Smoker    Packs/day: 1.00    Years: 10.00  . Smokeless tobacco: Never Used  . Alcohol use No     Allergies   Biaxin [clarithromycin]; Clarithromycin; Erythromycin; Penicillins; and Zithromax [azithromycin]   Review of Systems Review of Systems + sore throat + cough No pleuritic pain No wheezing + nasal congestion + post-nasal drainage No sinus pain/pressure No itchy/red eyes No earache No hemoptysis No SOB + fever, + chills No nausea No vomiting No abdominal pain No diarrhea No urinary symptoms No skin rash + fatigue + myalgias + headache Used OTC meds without relief   Physical Exam Triage Vital Signs ED Triage Vitals  Enc Vitals Group     BP 11/22/16 1859 115/81     Pulse Rate 11/22/16 1859 107     Resp 11/22/16 1859 16     Temp 11/22/16 1859 101.3 F (38.5 C)     Temp Source 11/22/16 1859 Oral     SpO2 11/22/16 1859 99 %     Weight 11/22/16 1900 120 lb (  54.4 kg)     Height 11/22/16 1900 5\' 3"  (1.6 m)     Head Circumference --      Peak Flow --      Pain Score 11/22/16 1901 0     Pain Loc --      Pain Edu? --      Excl. in [place]? --    No data found.   Updated Vital Signs BP 115/81 (BP Location: Left Arm)   Pulse 107   Temp 101.3 F (38.5 C) (Oral)   Resp 16   Ht 5\' 3"  (1.6 m)   Wt 120 lb (54.4 kg)   SpO2 99%   BMI 21.26 kg/m   Visual Acuity Right Eye Distance:   Left Eye Distance:   Bilateral Distance:    Right Eye Near:   Left Eye Near:    Bilateral Near:     Physical Exam Nursing notes and Vital Signs reviewed. Appearance:  Patient appears stated age, and in no acute distress Eyes:  Pupils are equal, round, and reactive to light and accomodation.  Extraocular movement is intact.  Conjunctivae are not inflamed    Ears:  Canals normal.  Tympanic membranes normal.  Nose:  Mildly congested turbinates.  No sinus tenderness.    Pharynx:  Normal Neck:  Supple.  Tender enlarged posterior/lateral nodes are palpated bilaterally  Lungs:  Clear to auscultation.  Breath sounds are equal.  Moving air well. Heart:  Regular rate and rhythm without murmurs, rubs, or gallops.  Abdomen:  Nontender without masses or hepatosplenomegaly.  Bowel sounds are present.  No CVA or flank tenderness.  Extremities:  No edema.  Skin:  No rash present.    UC Treatments / Results  Labs (all labs ordered are listed, but only abnormal results are displayed) Labs Reviewed - No data to display  EKG  EKG Interpretation None       Radiology Dg Chest 2 View  Result Date: 11/22/2016 CLINICAL DATA:  Shortness of breath, chest pain EXAM: CHEST  2 VIEW COMPARISON:  None. FINDINGS: The heart size and mediastinal contours are within normal limits. Both lungs are clear. The visualized skeletal structures are unremarkable. IMPRESSION: No active cardiopulmonary disease. Electronically Signed   By: Kathreen Devoid   On: 11/22/2016 20:14    Procedures Procedures (including critical care time)  Medications Ordered in UC Medications  acetaminophen (TYLENOL) tablet 975 mg (975 mg Oral Given 11/22/16 1902)     Initial Impression / Assessment and Plan / UC Course  I have reviewed the triage vital signs and the nursing notes.  Pertinent labs & imaging results that were available during my care of the patient were reviewed by me and considered in my medical decision making (see chart for details).    Begin Tamiflu.  Prescription written for Benzonatate [facility]) to take at bedtime for night-time cough.  Because patient has a history of pneumonia, will begin empiric doxycycline. Take plain guaifenesin (1200mg  extended release tabs such as Mucinex) twice daily, with plenty of water, for cough and congestion.  May add Pseudoephedrine (30mg ,  one or two every 4 to 6 hours) for sinus congestion.  Get adequate rest.   Try warm salt water gargles for sore throat.  Stop all antihistamines for now, and other non-prescription cough/cold preparations. May take Ibuprofen 200mg , 4 tabs every 8 hours with food for headache, fever, etc. Followup with Family Doctor if not improved in one week.   Final Clinical Impressions(s) / UC Diagnoses  Final diagnoses:  Influenza-like illness    New Prescriptions New Prescriptions   BENZONATATE (TESSALON) 200 MG CAPSULE    Take one cap by mouth at bedtime as needed for cough.  May repeat in 4 to 6 hours   DOXYCYCLINE (VIBRAMYCIN) 100 MG CAPSULE    Take 1 capsule (100 mg total) by mouth 2 (two) times daily. Take with food.   OSELTAMIVIR (TAMIFLU) 75 MG CAPSULE    Take 1 capsule (75 mg total) by mouth every 12 (twelve) hours.     Kandra Nicolas, MD 11/22/16 2034

## 2017-07-12 DIAGNOSIS — N809 Endometriosis, unspecified: Secondary | ICD-10-CM | POA: Insufficient documentation

## 2017-09-28 ENCOUNTER — Encounter: Payer: Self-pay | Admitting: Physician Assistant

## 2017-09-28 ENCOUNTER — Ambulatory Visit (INDEPENDENT_AMBULATORY_CARE_PROVIDER_SITE_OTHER): Payer: Managed Care, Other (non HMO) | Admitting: Physician Assistant

## 2017-09-28 VITALS — BP 113/73 | HR 63 | Ht 63.0 in | Wt 124.0 lb

## 2017-09-28 DIAGNOSIS — Z79890 Hormone replacement therapy (postmenopausal): Secondary | ICD-10-CM | POA: Diagnosis not present

## 2017-09-28 DIAGNOSIS — F900 Attention-deficit hyperactivity disorder, predominantly inattentive type: Secondary | ICD-10-CM | POA: Diagnosis not present

## 2017-09-28 DIAGNOSIS — D126 Benign neoplasm of colon, unspecified: Secondary | ICD-10-CM | POA: Diagnosis not present

## 2017-09-28 DIAGNOSIS — L719 Rosacea, unspecified: Secondary | ICD-10-CM | POA: Diagnosis not present

## 2017-09-28 DIAGNOSIS — E8940 Asymptomatic postprocedural ovarian failure: Secondary | ICD-10-CM

## 2017-09-28 DIAGNOSIS — Z800 Family history of malignant neoplasm of digestive organs: Secondary | ICD-10-CM | POA: Diagnosis not present

## 2017-09-28 DIAGNOSIS — Z720 Tobacco use: Secondary | ICD-10-CM | POA: Diagnosis not present

## 2017-09-28 DIAGNOSIS — F419 Anxiety disorder, unspecified: Secondary | ICD-10-CM

## 2017-09-28 MED ORDER — LISDEXAMFETAMINE DIMESYLATE 20 MG PO CAPS: 20.0000 mg | ORAL_CAPSULE | ORAL | 0 refills | Status: DC

## 2017-09-28 MED ORDER — CLINDAMYCIN PHOS-BENZOYL PEROX 1-5 % EX GEL: Freq: Two times a day (BID) | CUTANEOUS | 1 refills | Status: DC

## 2017-09-28 NOTE — Progress Notes (Signed)
Subjective:    Patient ID: Diamond Ellis, female    DOB: [birth year], 42 y.o.   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 She is on HRT and doing well.   She would like to start back on ADHD medication.she was dx with ADHD in middle school.  At some point she was seeing a psychiatrist who thought her inability to focus and finish task could be due to anxiety and depression. She went off adderall and started celexa and buspar. She had no real benefit and went off it. She did not go back on adderall because she did not like the way adderall made her feel. Her daughter is on vyvanse and would like to try this.   Pt also would like to discuss rosacea.she see Dr. Frederico Hamman. She is on low dose doxycycline and helped a lot but she still have "bumps". This makes her very self conscious and would like something done about it. She knows wine and other triggers make it worse.     Pt needs referral for colonoscopy due to hx of polyps and strong family hx of early colon cancer.   .. Active Ambulatory Problems    Diagnosis Date Noted  . RLS (restless legs syndrome) 10/30/2014  . Dry skin 10/30/2014  . Loss of weight 10/30/2014  . Insomnia 10/30/2014  . Palpitations 10/30/2014  . Generalized anxiety disorder 10/30/2014  . Adenomatous polyp of colon 09/28/2017  . Family hx of colon cancer 09/28/2017  . Rosacea 09/30/2017  . 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 Anxiety 09/30/2017   Resolved Ambulatory Problems    Diagnosis Date Noted  . No Resolved Ambulatory Problems   No Additional Past Medical History       Review of Systems  All other systems reviewed and are negative.      Objective:   Physical Exam  Constitutional: She is oriented to person, place, and time.  She appears well-developed and well-nourished.  HENT:  Head: Normocephalic and atraumatic.  Cardiovascular: Normal rate, regular rhythm and normal heart sounds.  Pulmonary/Chest: Effort normal and breath sounds normal. She has no wheezes.  Neurological: She is alert and oriented to person, place, and time.  Skin:  Diffuse white papules mostly around cheeks and chins. No noticeable erythema. Pt does have make up on.   Psychiatric: She has a normal mood and affect. Her behavior is normal.          Assessment & Plan:  Marland KitchenMarland KitchenAmy was seen today for adhd.  Diagnoses and all orders for this visit:  Attention deficit hyperactivity disorder (ADHD), predominantly inattentive type -     lisdexamfetamine (VYVANSE) 20 MG capsule; Take 1 capsule (20 mg total) by mouth every morning.  Adenomatous polyp of colon, unspecified part of colon -     Ambulatory referral to Gastroenterology  Family hx of colon cancer -     Ambulatory referral to Gastroenterology  Rosacea -     clindamycin-benzoyl peroxide (BENZACLIN) gel; Apply topically 2 (two) times daily.  Anxiety  Tobacco abuse  Surgical menopause on hormone replacement therapy   Mammogram done.   Restarted vyvyanse. Follow up in one month. Pt reports she could still have documentation of ADHD testing. She will bring in.   Referral made for  GI.   Pt not ready to stop smoking. Discussed HRT/smoking risk for stroke/DVT.   Encouraged patient to follow up with dermatology. Continue on doxycycline. Given benzaclin cream to use at night.   Needs CPE and labs.

## 2017-09-28 NOTE — Patient Instructions (Signed)
Rosacea Rosacea is a long-term (chronic) condition that affects the skin of the face, including the cheeks, nose, brow, and chin. This condition can also affect the eyes. Rosacea causes blood vessels near the surface of the skin to get bigger (be enlarged), and that makes the skin red. There is no cure for this condition, but treatment can help to control your symptoms. Follow these instructions at home: Skin Care Take care of your skin as told by your doctor. Your doctor may tell you do these things:  Wash your skin gently two or more times each day.  Use mild soap.  Use a sunscreen or sunblock with SPF 30 or greater.  Use gentle cosmetics that are meant for sensitive skin.  Shave with an electric shaver instead of a blade.  Lifestyle  Try to keep track of what foods trigger this condition. Avoid any triggers. These may include: ? Spicy foods. ? Seafood. ? Cheese. ? Hot liquids. ? Nuts. ? Chocolate. ? Iodized salt.  Do not drink alcohol.  Avoid extremely cold or hot temperatures.  Try to reduce your stress. If you need help to do this, talk with your doctor.  When you exercise, do these things to stay cool: ? Limit your sun exposure. ? Use a fan. ? Exercise for a shorter time, and exercise more often. General instructions  Keep all follow-up visits as told by your doctor. This is important.  Take over-the-counter and prescription medicines only as told by your doctor.  If your eyelids are affected, press warm compresses to them. Do this as told by your doctor.  If you were prescribed an antibiotic medicine, apply or take it as told by your doctor. Do not stop using the antibiotic even if your condition improves. Contact a doctor if:  Your symptoms get worse.  Your symptoms do not improve after two months of treatment.  You have new symptoms.  You have any changes in vision or you have problems with your eyes, such as redness or itching.  You feel very sad  (depressed).  You lose your appetite.  You have trouble concentrating. This information is not intended to replace advice given to you by your health care provider. Make sure you discuss any questions you have with your health care provider. Document Released: 12/06/2011 Document Revised: 02/19/2016 Document Reviewed: 11/20/2014 Elsevier Interactive Patient Education  2018 [employer] Inc.  

## 2017-09-30 ENCOUNTER — Encounter: Payer: Self-pay | Admitting: Physician Assistant

## 2017-09-30 DIAGNOSIS — F900 Attention-deficit hyperactivity disorder, predominantly inattentive type: Secondary | ICD-10-CM | POA: Insufficient documentation

## 2017-09-30 DIAGNOSIS — F419 Anxiety disorder, unspecified: Secondary | ICD-10-CM | POA: Insufficient documentation

## 2017-09-30 DIAGNOSIS — E8940 Asymptomatic postprocedural ovarian failure: Secondary | ICD-10-CM | POA: Insufficient documentation

## 2017-09-30 DIAGNOSIS — Z720 Tobacco use: Secondary | ICD-10-CM | POA: Insufficient documentation

## 2017-09-30 DIAGNOSIS — L719 Rosacea, unspecified: Secondary | ICD-10-CM | POA: Insufficient documentation

## 2017-09-30 DIAGNOSIS — Z79890 Hormone replacement therapy (postmenopausal): Secondary | ICD-10-CM | POA: Insufficient documentation

## 2017-09-30 DIAGNOSIS — F17200 Nicotine dependence, unspecified, uncomplicated: Secondary | ICD-10-CM | POA: Insufficient documentation

## 2017-10-04 ENCOUNTER — Emergency Department (INDEPENDENT_AMBULATORY_CARE_PROVIDER_SITE_OTHER)
Admission: EM | Admit: 2017-10-04 | Discharge: 2017-10-04 | Disposition: A | Discharge status: Home / Self Care | Payer: Managed Care, Other (non HMO) | Source: Home / Self Care | Attending: Family Medicine | Admitting: Family Medicine

## 2017-10-04 ENCOUNTER — Other Ambulatory Visit: Payer: Self-pay

## 2017-10-04 ENCOUNTER — Encounter: Payer: Self-pay | Admitting: Emergency Medicine

## 2017-10-04 DIAGNOSIS — J069 Acute upper respiratory infection, unspecified: Secondary | ICD-10-CM

## 2017-10-04 DIAGNOSIS — J029 Acute pharyngitis, unspecified: Secondary | ICD-10-CM

## 2017-10-04 DIAGNOSIS — B9789 Other viral agents as the cause of diseases classified elsewhere: Secondary | ICD-10-CM

## 2017-10-04 LAB — POCT RAPID STREP A (OFFICE): RAPID STREP A SCREEN: NEGATIVE

## 2017-10-04 MED ORDER — SULFAMETHOXAZOLE-TRIMETHOPRIM 800-160 MG PO TABS: 1.0000 | ORAL_TABLET | Freq: Two times a day (BID) | ORAL | 0 refills | Status: DC

## 2017-10-04 MED ORDER — PREDNISONE 20 MG PO TABS: ORAL_TABLET | ORAL | 0 refills | Status: DC

## 2017-10-04 NOTE — ED Provider Notes (Signed)
[name]    CSN: [id number] Arrival date & time: 10/04/17  1322     History   Chief Complaint Chief Complaint  Patient presents with  . Sore Throat    HPI Diamond Ellis is a 42 y.o. female.   Three days ago patient developed sore throat, fatigue, sinus congestion, sneezing, headache, and possible fever.  She has a past history of frequent otitis media as a child.   The history is provided by the patient.    History reviewed. No pertinent past medical history.  Patient Active Problem List   Diagnosis Date Noted  . Rosacea 09/30/2017  . 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 Anxiety 09/30/2017  . Tobacco abuse 09/30/2017  . Surgical menopause on hormone replacement therapy 09/30/2017  . Adenomatous polyp of colon 09/28/2017  . Family hx of colon cancer 09/28/2017  . RLS (restless legs syndrome) 10/30/2014  . Dry skin 10/30/2014  . Loss of weight 10/30/2014  . Insomnia 10/30/2014  . Palpitations 10/30/2014  . Generalized anxiety disorder 10/30/2014    Past Surgical History:  Procedure Laterality Date  . ABDOMINAL HYSTERECTOMY      OB History    No data available       Home Medications    Prior to Admission medications   Medication Sig Start Date End Date Taking? Authorizing Provider  clindamycin-benzoyl peroxide (BENZACLIN) gel Apply topically 2 (two) times daily. 09/28/17   Breeback, Jade L, PA-C  estradiol (VIVELLE-DOT) 0.075 MG/24HR Place onto the skin. 08/22/17   [provider]  lisdexamfetamine (VYVANSE) 20 MG capsule Take 1 capsule (20 mg total) by mouth every morning. 09/28/17   Breeback, Jade L, PA-C  predniSONE (DELTASONE) 20 MG tablet Take one tab by mouth twice daily for 4 days, then one daily. Take with food. 10/04/17   Kandra Nicolas, MD  sulfamethoxazole-trimethoprim (BACTRIM DS,SEPTRA DS) 800-160 MG tablet Take 1 tablet by mouth 2 (two) times daily. (Rx void after 10/12/17)  10/04/17   Kandra Nicolas, MD    Family History Family History  Problem Relation Age of Onset  . Cancer Father        Lung and Kidney  . Heart failure Father   . Diabetes Father   . Alcohol abuse Father   . Hyperlipidemia Father   . Stroke Father     Social History Social History   Tobacco Use  . Smoking status: Current Every Day Smoker    Packs/day: 1.00    Years: 10.00    Pack years: 10.00  . Smokeless tobacco: Never Used  Substance Use Topics  . Alcohol use: No  . Drug use: No     Allergies   Biaxin [clarithromycin]; Chantix [varenicline]; Clarithromycin; Erythromycin; Penicillins; and Zithromax [azithromycin]   Review of Systems Review of Systems + sore throat No cough + sneezing No pleuritic pain No wheezing + nasal congestion + post-nasal drainage + sinus pain/pressure No itchy/red eyes ? earache No hemoptysis No SOB ? Fever  No nausea No vomiting No abdominal pain No diarrhea No urinary symptoms No skin rash + fatigue No myalgias + headache Used OTC meds without relief   Physical Exam Triage Vital Signs ED Triage Vitals  Enc Vitals Group     BP 10/04/17 1403 106/72     Pulse Rate 10/04/17 1403 80     Resp --      Temp 10/04/17 1403 97.8 F (36.6 C)     Temp Source 10/04/17  1403 Oral     SpO2 10/04/17 1403 98 %     Weight 10/04/17 1404 125 lb (56.7 kg)     Height 10/04/17 1404 5\' 3"  (1.6 m)     Head Circumference --      Peak Flow --      Pain Score 10/04/17 1404 4     Pain Loc --      Pain Edu? --      Excl. in [place]? --    No data found.  Updated Vital Signs BP 106/72 (BP Location: Right Arm)   Pulse 80   Temp 97.8 F (36.6 C) (Oral)   Ht 5\' 3"  (1.6 m)   Wt 125 lb (56.7 kg)   SpO2 98%   BMI 22.14 kg/m   Visual Acuity Right Eye Distance:   Left Eye Distance:   Bilateral Distance:    Right Eye Near:   Left Eye Near:    Bilateral Near:     Physical Exam Nursing notes and Vital Signs reviewed. Appearance:   Patient appears stated age, and in no acute distress Eyes:  Pupils are equal, round, and reactive to light and accomodation.  Extraocular movement is intact.  Conjunctivae are not inflamed  Ears:  Canals normal.  Tympanic membranes normal.  Nose:  Mildly congested turbinates.  No sinus tenderness.  Pharynx:  Normal Neck:  Supple.  Enlarged posterior/lateral nodes are palpated bilaterally, tender to palpation on the left.   Lungs:  Clear to auscultation.  Breath sounds are equal.  Moving air well. Heart:  Regular rate and rhythm without murmurs, rubs, or gallops.  Abdomen:  Nontender without masses or hepatosplenomegaly.  Bowel sounds are present.  No CVA or flank tenderness.  Extremities:  No edema.  Skin:  No rash present.    UC Treatments / Results  Labs (all labs ordered are listed, but only abnormal results are displayed) Labs Reviewed  POCT RAPID STREP A (OFFICE) negative  Tympanometry:  Right ear tympanogram normal; Left ear tympanogram wide  EKG  EKG Interpretation None       Radiology No results found.  Procedures Procedures (including critical care time)  Medications Ordered in UC Medications - No data to display   Initial Impression / Assessment and Plan / UC Course  I have reviewed the triage vital signs and the nursing notes.  Pertinent labs & imaging results that were available during my care of the patient were reviewed by me and considered in my medical decision making (see chart for details).    There is no evidence of bacterial infection today.  Treat symptomatically for now  Begin prednisone burst/taper. Take plain guaifenesin (1200mg  extended release tabs such as Mucinex) twice daily, with plenty of water, for cough and congestion.  Get adequate rest.   May use Afrin nasal spray (or generic oxymetazoline) each morning for about 5 days and then discontinue.  Also recommend using saline nasal spray several times daily and saline nasal irrigation (AYR is a  common brand).  Use Flonase nasal spray each morning after using Afrin nasal spray and saline nasal irrigation. Try warm salt water gargles for sore throat.  Stop all antihistamines for now, and other non-prescription cough/cold preparations. May take Delsym Cough Suppressant at bedtime for nighttime cough.  Begin Bactrim DS if not improving about one week or if persistent fever develops (Given a prescription to hold, with an expiration date)  Followup with Family Doctor if not improved in 10 days.  Final Clinical Impressions(s) / UC Diagnoses   Final diagnoses:  Acute pharyngitis, unspecified etiology  Viral URI with cough    ED Discharge Orders        Ordered    predniSONE (DELTASONE) 20 MG tablet     10/04/17 1437    sulfamethoxazole-trimethoprim (BACTRIM DS,SEPTRA DS) 800-160 MG tablet  2 times daily     10/04/17 1437           Kandra Nicolas, MD 10/09/17 [phone]

## 2017-10-04 NOTE — ED Triage Notes (Signed)
Sore throat, Sinus pain, pressure behind eyes and teeth, congestion x 3 days

## 2017-10-04 NOTE — Discharge Instructions (Signed)
Take plain guaifenesin (1200mg  extended release tabs such as Mucinex) twice daily, with plenty of water, for cough and congestion.  Get adequate rest.   May use Afrin nasal spray (or generic oxymetazoline) each morning for about 5 days and then discontinue.  Also recommend using saline nasal spray several times daily and saline nasal irrigation (AYR is a common brand).  Use Flonase nasal spray each morning after using Afrin nasal spray and saline nasal irrigation. Try warm salt water gargles for sore throat.  Stop all antihistamines for now, and other non-prescription cough/cold preparations. May take Delsym Cough Suppressant at bedtime for nighttime cough.  Begin Bactrim DS if not improving about one week or if persistent fever develops

## 2017-10-26 ENCOUNTER — Ambulatory Visit (INDEPENDENT_AMBULATORY_CARE_PROVIDER_SITE_OTHER): Payer: Managed Care, Other (non HMO) | Admitting: Physician Assistant

## 2017-10-26 ENCOUNTER — Encounter: Payer: Self-pay | Admitting: Physician Assistant

## 2017-10-26 VITALS — BP 101/67 | HR 82 | Ht 63.0 in | Wt 122.0 lb

## 2017-10-26 DIAGNOSIS — F17200 Nicotine dependence, unspecified, uncomplicated: Secondary | ICD-10-CM | POA: Diagnosis not present

## 2017-10-26 DIAGNOSIS — F900 Attention-deficit hyperactivity disorder, predominantly inattentive type: Secondary | ICD-10-CM

## 2017-10-26 MED ORDER — LISDEXAMFETAMINE DIMESYLATE 30 MG PO CAPS: 30.0000 mg | ORAL_CAPSULE | Freq: Every day | ORAL | 0 refills | Status: DC

## 2017-10-26 NOTE — Progress Notes (Signed)
   Subjective:    Patient ID: Diamond Ellis, female    DOB: [birth year], 42 y.o.   MRN: [id number]  HPI Pt is a 42 yo female who presents to the clinic to follow up on ADHD. She is doing well on vyvanse with no side effects of anxiety, palpitations, insomnia. She is doing much better at work. Her manager has noticed and commented how much more productive she is at work. She does feel like there is some room for improvement.   .. Active Ambulatory Problems    Diagnosis Date Noted  . RLS (restless legs syndrome) 10/30/2014  . Dry skin 10/30/2014  . Loss of weight 10/30/2014  . Insomnia 10/30/2014  . Palpitations 10/30/2014  . Generalized anxiety disorder 10/30/2014  . Adenomatous polyp of colon 09/28/2017  . Family hx of colon cancer 09/28/2017  . Rosacea 09/30/2017  . 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 Anxiety 09/30/2017  . Tobacco abuse 09/30/2017  . Surgical menopause on hormone replacement therapy 09/30/2017   Resolved Ambulatory Problems    Diagnosis Date Noted  . No Resolved Ambulatory Problems   No Additional Past Medical History      Review of Systems  All other systems reviewed and are negative.      Objective:   Physical Exam  Constitutional: She is oriented to person, place, and time. She appears well-developed and well-nourished.  HENT:  Head: Normocephalic and atraumatic.  Cardiovascular: Normal rate, regular rhythm and normal heart sounds.  Pulmonary/Chest: Effort normal and breath sounds normal.  Neurological: She is alert and oriented to person, place, and time.  Psychiatric: She has a normal mood and affect. Her behavior is normal.          Assessment & Plan:  .Marland KitchenMarland KitchenAmy was seen today for adhd.  Diagnoses and all orders for this visit:  Attention deficit hyperactivity disorder (ADHD), predominantly inattentive type -     lisdexamfetamine (VYVANSE) 30 MG capsule; Take 1 capsule (30 mg total) by  mouth daily.  Needs smoking cessation education  Other orders -     Discontinue: lisdexamfetamine (VYVANSE) 30 MG capsule; Take 1 capsule (30 mg total) by mouth daily. -     Discontinue: lisdexamfetamine (VYVANSE) 30 MG capsule; Take 1 capsule (30 mg total) by mouth daily.   Increased to 30mg . Refilled for 3 months.   Discussed chantix vs wellbutrin vs nicotine patches. She does not want to try anything until she starts and finishes Accutane by dermatology. She has a smoking cessation plan through work she can join. Discussed risk of continuing to smoke.

## 2018-01-24 ENCOUNTER — Ambulatory Visit: Payer: Managed Care, Other (non HMO) | Admitting: Physician Assistant

## 2018-01-24 DIAGNOSIS — Z0189 Encounter for other specified special examinations: Secondary | ICD-10-CM

## 2018-01-28 IMAGING — DX DG CHEST 2V
2 series · 2 of 2 positions shown · non-contrast
Comparison: None.

CLINICAL DATA: Shortness of breath, chest pain

EXAM:
CHEST  2 VIEW

[chest pa]
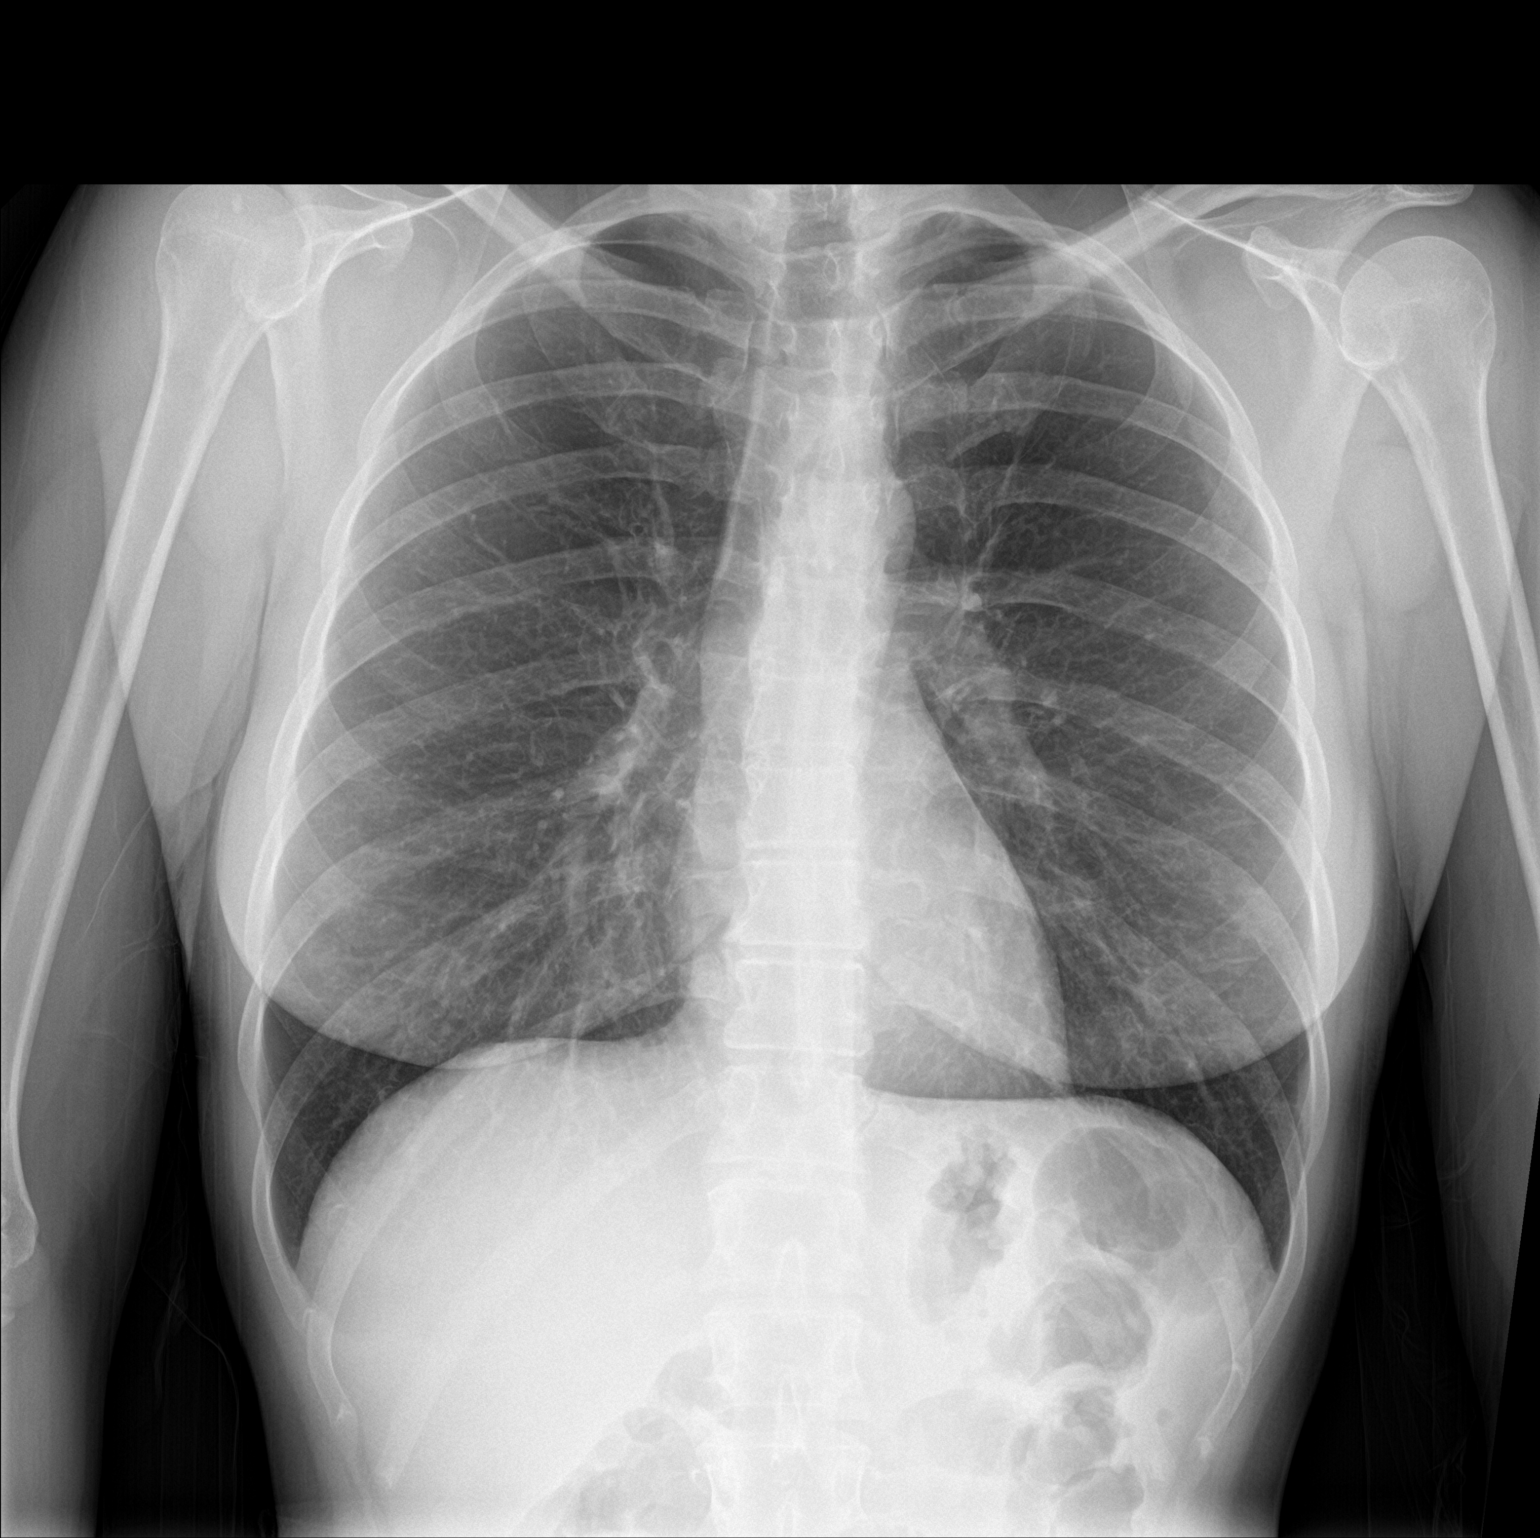

[chest lat]
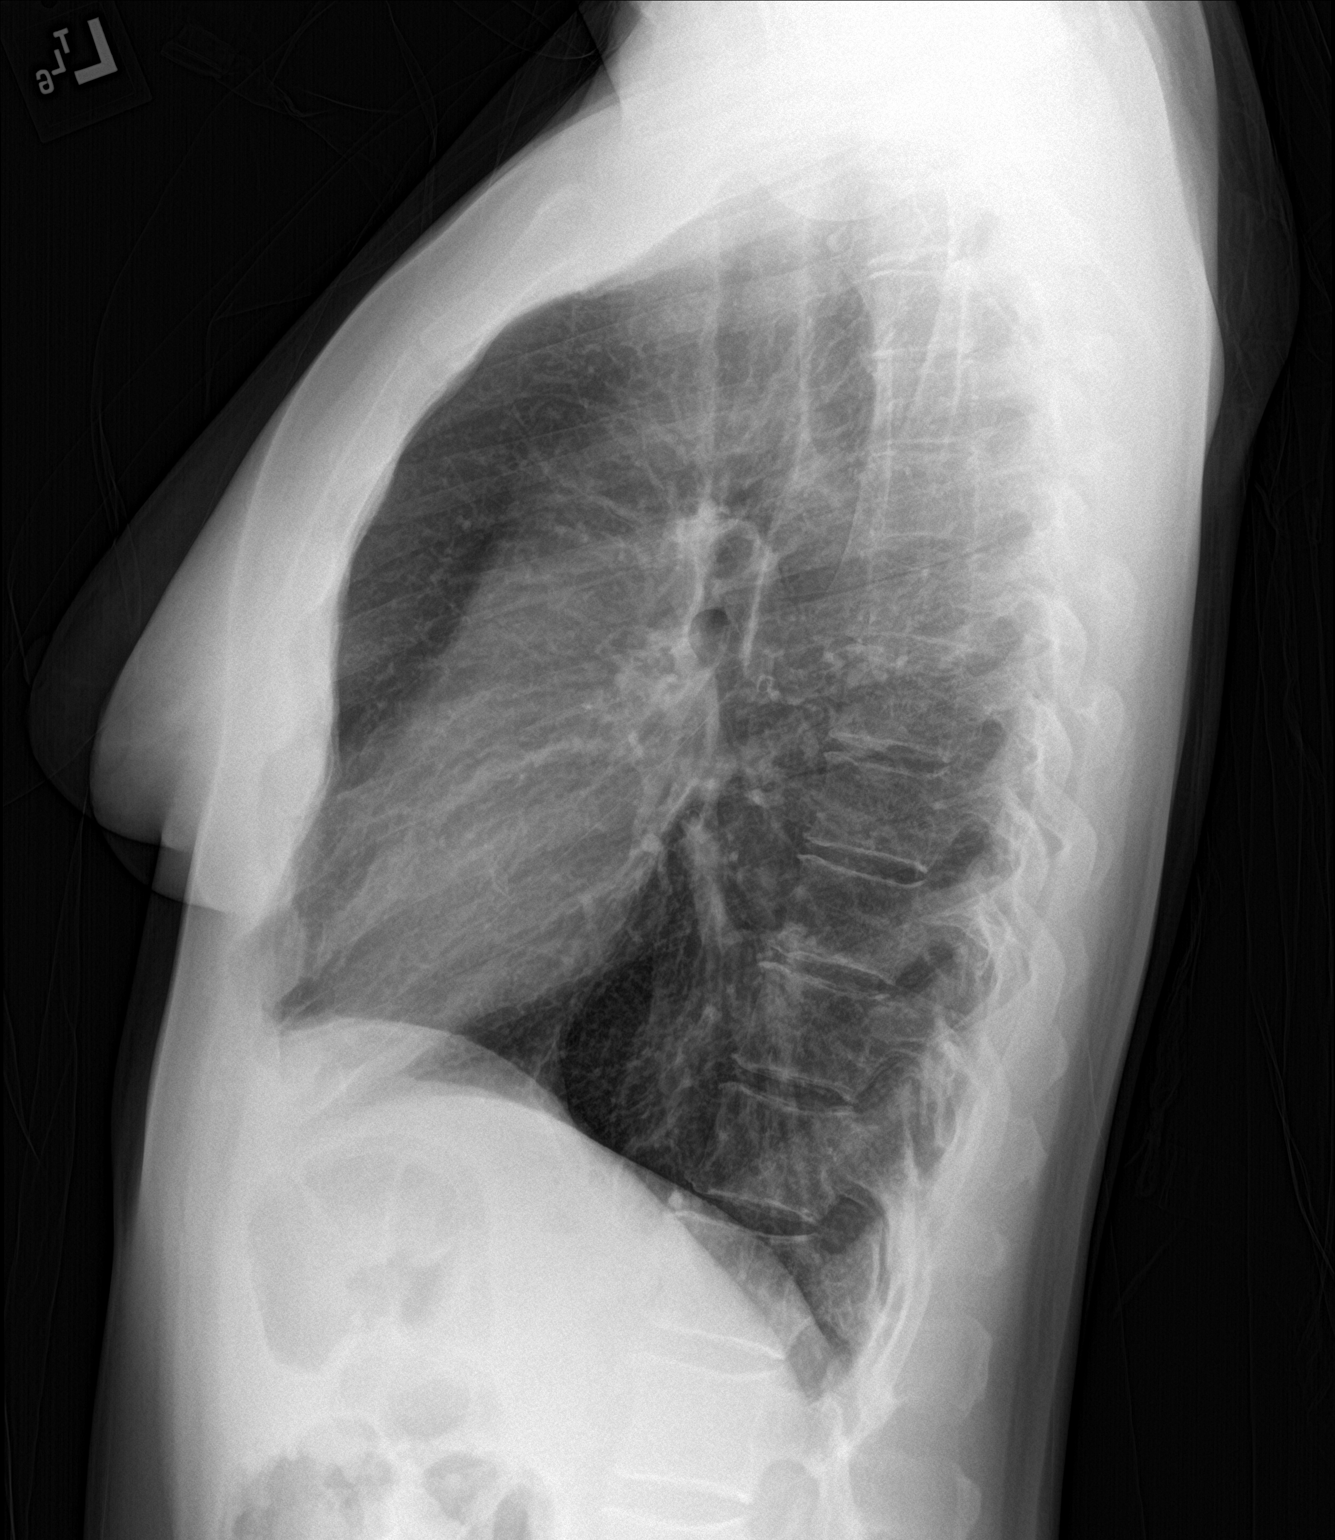

[2 of 2 positions shown; findings below may reference images not displayed]

FINDINGS: The heart size and mediastinal contours are within normal limits.
Both lungs are clear. The visualized skeletal structures are
unremarkable.
IMPRESSION: No active cardiopulmonary disease.

## 2018-02-08 ENCOUNTER — Telehealth: Payer: Self-pay | Admitting: Physician Assistant

## 2018-02-08 DIAGNOSIS — F900 Attention-deficit hyperactivity disorder, predominantly inattentive type: Secondary | ICD-10-CM

## 2018-02-08 MED ORDER — LISDEXAMFETAMINE DIMESYLATE 30 MG PO CAPS: 30.0000 mg | ORAL_CAPSULE | Freq: Every day | ORAL | 0 refills | Status: DC

## 2018-02-08 NOTE — Telephone Encounter (Signed)
Patient would like the prescription sent to [facility] on [address], [place], [facility] the [facility] [address]. Thanks!

## 2018-02-08 NOTE — Telephone Encounter (Signed)
Patient has been out of her Vyvanse. She has scheduled a physical with Jade on Monday but is requesting that a few days worth of Vyvanse be called in to get her through to Monday. Please advise. Thanks!

## 2018-02-08 NOTE — Addendum Note (Signed)
Addended by: Donella Stade on: 02/08/2018 05:20 PM   Modules accepted: Orders

## 2018-02-08 NOTE — Telephone Encounter (Signed)
sent 

## 2018-02-13 ENCOUNTER — Encounter: Payer: Managed Care, Other (non HMO) | Admitting: Physician Assistant

## 2018-06-15 ENCOUNTER — Encounter: Payer: Self-pay | Admitting: Physician Assistant

## 2018-06-15 ENCOUNTER — Telehealth: Payer: Self-pay

## 2018-06-15 ENCOUNTER — Ambulatory Visit (INDEPENDENT_AMBULATORY_CARE_PROVIDER_SITE_OTHER): Payer: Managed Care, Other (non HMO) | Admitting: Physician Assistant

## 2018-06-15 VITALS — BP 108/76 | HR 94 | Temp 97.6°F | Ht 63.0 in | Wt 128.0 lb

## 2018-06-15 DIAGNOSIS — R6889 Other general symptoms and signs: Secondary | ICD-10-CM

## 2018-06-15 DIAGNOSIS — J00 Acute nasopharyngitis [common cold]: Secondary | ICD-10-CM

## 2018-06-15 DIAGNOSIS — F900 Attention-deficit hyperactivity disorder, predominantly inattentive type: Secondary | ICD-10-CM

## 2018-06-15 LAB — POC INFLUENZA A&B (BINAX/QUICKVUE)
Influenza A, POC: NEGATIVE
Influenza B, POC: NEGATIVE

## 2018-06-15 MED ORDER — IPRATROPIUM BROMIDE 0.06 % NA SOLN: 2.0000 | Freq: Four times a day (QID) | NASAL | 0 refills | Status: DC | PRN

## 2018-06-15 NOTE — Telephone Encounter (Signed)
Diamond Ellis came into the office today to see Evlyn Clines as she was not feeling very well. Kyilee asked me if it would be possible for [place] to refill her Vyvanse? I just wanted to ask you first to make sure it would be okay to refill. Thanks!

## 2018-06-15 NOTE — Progress Notes (Signed)
HPI:                                                                Diamond Ellis is a 42 y.o. female who presents to [facility] today for URI symptoms  URI   This is a new problem. The current episode started yesterday. The problem has been unchanged. The maximum temperature recorded prior to her arrival was 101 - 101.9 F. Associated symptoms include congestion, coughing (productive of sputum), diarrhea, rhinorrhea, sinus pain, a sore throat and swollen glands. Pertinent negatives include no ear pain, nausea, rash or vomiting. Associated symptoms comments: + myalgia. She has tried acetaminophen (OTC sinus) for the symptoms. The treatment provided mild relief.  History significant for pneumonia (4 years ago) and tobacco use    No flowsheet data found.  No flowsheet data found.    History reviewed. No pertinent past medical history. Past Surgical History:  Procedure Laterality Date  . ABDOMINAL HYSTERECTOMY     Social History   Tobacco Use  . Smoking status: Current Every Day Smoker    Packs/day: 1.00    Years: 10.00    Pack years: 10.00  . Smokeless tobacco: Never Used  Substance Use Topics  . Alcohol use: No   family history includes Alcohol abuse in her father; Cancer in her father; Diabetes in her father; Heart failure in her father; Hyperlipidemia in her father; Stroke in her father.    ROS: negative except as noted in the HPI  Medications: Current Outpatient Medications  Medication Sig Dispense Refill  . clindamycin-benzoyl peroxide (BENZACLIN) gel Apply topically 2 (two) times daily. 30 g 1  . estradiol (VIVELLE-DOT) 0.075 MG/24HR Place onto the skin.    Marland Kitchen lisdexamfetamine (VYVANSE) 30 MG capsule Take 1 capsule (30 mg total) by mouth daily. 30 capsule 0  . ipratropium (ATROVENT) 0.06 % nasal spray Place 2 sprays into both nostrils 4 (four) times daily as needed for rhinitis. 15 mL 0   No current facility-administered  medications for this visit.    Allergies  Allergen Reactions  . Biaxin [Clarithromycin]   . Chantix [Varenicline]     Crazy dream  . Clarithromycin   . Erythromycin   . Penicillins   . Zithromax [Azithromycin]        Objective:  BP 108/76   Pulse 94   Temp 97.6 F (36.4 C) (Oral)   Ht 5\' 3"  (1.6 m)   Wt 128 lb (58.1 kg)   SpO2 100%   BMI 22.67 kg/m  Gen:  alert, ill-appearing, not toxic-appearing, no distress, appropriate for age 81: head normocephalic without obvious abnormality, conjunctiva and cornea clear, TM's pearly gray and semi-transparent, nasal mucosa edematous, frontal and maxillary sinus pressure, oropharynx clear, no exudates, uvula midline, moist mucous membranes, no cervical adenopathy, trachea midline Pulm: Normal work of breathing, normal phonation, clear to auscultation bilaterally, no wheezes, rales or rhonchi CV: Normal rate, regular rhythm, s1 and s2 distinct, no murmurs, clicks or rubs  Neuro: alert and oriented x 3, no tremor MSK: extremities atraumatic, normal gait and station Skin: intact, no rashes on exposed skin, no jaundice, no cyanosis    Results for orders placed or performed in visit on 06/15/18 (from the past 72 hour(s))  POC Influenza A&B (Binax test)     Status: None   Collection Time: 06/15/18  1:41 PM  Result Value Ref Range   Influenza A, POC Negative Negative   Influenza B, POC Negative Negative   No results found.    Assessment and Plan: 42 y.o. female with   .Diagnoses and all orders for this visit:  Flu-like symptoms -     POC Influenza A&B (Binax test) -     ipratropium (ATROVENT) 0.06 % nasal spray; Place 2 sprays into both nostrils 4 (four) times daily as needed for rhinitis.  Nasopharyngitis -     ipratropium (ATROVENT) 0.06 % nasal spray; Place 2 sprays into both nostrils 4 (four) times daily as needed for rhinitis.   - afebrile, no tachypnea, no tachycardia, SpO2 100% on RA at rest, no adventitious lung  sounds - POC influenza A/B negative - supportive care - work note provided for remainder of the week   Patient education and anticipatory guidance given Patient agrees with treatment plan Follow-up as needed if symptoms worsen or fail to improve  Darlyne Russian PA-C

## 2018-06-15 NOTE — Patient Instructions (Signed)
For nasal symptoms/sinusitis: - OTC Afrin nasal spray (warning: do not use for more than 3 days, leads to rebound congestion) - nasal saline rinses / netti pot (do this prior to nasal spray) - warm facial compresses - oral decongestants and antihistamines like Claritin-D and Zyrtec-D may help dry up secretions (caution using decongestants if you have high blood pressure, heart disease or kidney disease) - for sinus headache: Tylenol 1000mg  every 8 hours as needed for throat pain. Alternate with Ibuprofen 600mg  every 6 hours  For sore throat: - Tylenol 1000mg  every 8 hours as needed for throat pain. Alternate with Ibuprofen 600mg  every 6 hours - Cepacol throat lozenges and/or Chloraseptic spray - Warm salt water gargles  For cough: - Mucinex with at least 8 oz. of water to make cough more productive - Delsym or Roitussin to help suppress cough  Note: follow package instructions for all over-the-counter medications. If using multi-symptom medications (Dayquil, Theraflu, etc.), check the label for duplicate drug ingredients.

## 2018-06-16 MED ORDER — LISDEXAMFETAMINE DIMESYLATE 30 MG PO CAPS: 30.0000 mg | ORAL_CAPSULE | Freq: Every day | ORAL | 0 refills | Status: DC

## 2018-06-16 NOTE — Telephone Encounter (Signed)
Sent one month need appt.

## 2018-06-17 ENCOUNTER — Encounter: Payer: Self-pay | Admitting: Emergency Medicine

## 2018-06-17 ENCOUNTER — Emergency Department (INDEPENDENT_AMBULATORY_CARE_PROVIDER_SITE_OTHER)
Admission: EM | Admit: 2018-06-17 | Discharge: 2018-06-17 | Disposition: A | Discharge status: Home / Self Care | Payer: Managed Care, Other (non HMO) | Source: Home / Self Care | Attending: Family Medicine | Admitting: Family Medicine

## 2018-06-17 ENCOUNTER — Emergency Department (INDEPENDENT_AMBULATORY_CARE_PROVIDER_SITE_OTHER): Payer: Managed Care, Other (non HMO)

## 2018-06-17 DIAGNOSIS — R062 Wheezing: Secondary | ICD-10-CM

## 2018-06-17 DIAGNOSIS — J0100 Acute maxillary sinusitis, unspecified: Secondary | ICD-10-CM | POA: Diagnosis not present

## 2018-06-17 DIAGNOSIS — R05 Cough: Secondary | ICD-10-CM

## 2018-06-17 MED ORDER — SULFAMETHOXAZOLE-TRIMETHOPRIM 800-160 MG PO TABS: 1.0000 | ORAL_TABLET | Freq: Two times a day (BID) | ORAL | 0 refills | Status: AC

## 2018-06-17 NOTE — ED Triage Notes (Signed)
Pt c/o sinus pressure, facial pain and left ear pain since Thursday. Saw pcp and only given nasal spray.

## 2018-06-17 NOTE — Discharge Instructions (Signed)
°  You may take 500mg  acetaminophen every 4-6 hours or in combination with ibuprofen 400-600mg  every 6-8 hours as needed for pain, inflammation, and fever.  Be sure to drink water to stay well hydrated and get at least 8 hours of sleep at night, preferably more while sick.   Please take antibiotics as prescribed and be sure to complete entire course even if you start to feel better to ensure infection does not come back.  Please follow up with family medicine in 7-10 days if not improving.

## 2018-06-17 NOTE — ED Provider Notes (Signed)
[name]    CSN: [id number] Arrival date & time: 06/17/18  1529     History   Chief Complaint Chief Complaint  Patient presents with  . Facial Pain    HPI Diamond Ellis is a 42 y.o. female.   HPI Diamond Ellis is a 42 y.o. female presenting to UC with c/o 3-4 days of worsening sinus pain and pressure, worse on Left side with ear popping and fullness.  She was seen by her PCP initially for same. Tested negative for the flu. She was prescribed ipratropium nasal spray but is no better. She reports 3 days of fever, Tmax 102*F. Denies n/v/d. No difficulty breathing but she has had pneumonia before.    History reviewed. No pertinent past medical history.  Patient Active Problem List   Diagnosis Date Noted  . Rosacea 09/30/2017  . 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 Anxiety 09/30/2017  . Tobacco abuse 09/30/2017  . Surgical menopause on hormone replacement therapy 09/30/2017  . Adenomatous polyp of colon 09/28/2017  . Family hx of colon cancer 09/28/2017  . RLS (restless legs syndrome) 10/30/2014  . Dry skin 10/30/2014  . Loss of weight 10/30/2014  . Insomnia 10/30/2014  . Palpitations 10/30/2014  . Generalized anxiety disorder 10/30/2014    Past Surgical History:  Procedure Laterality Date  . ABDOMINAL HYSTERECTOMY      OB History   None      Home Medications    Prior to Admission medications   Medication Sig Start Date End Date Taking? Authorizing Provider  clindamycin-benzoyl peroxide (BENZACLIN) gel Apply topically 2 (two) times daily. 09/28/17   Breeback, Jade L, PA-C  estradiol (VIVELLE-DOT) 0.075 MG/24HR Place onto the skin. 08/22/17   [provider]  ipratropium (ATROVENT) 0.06 % nasal spray Place 2 sprays into both nostrils 4 (four) times daily as needed for rhinitis. 06/15/18   Trixie Dredge, PA-C  lisdexamfetamine (VYVANSE) 30 MG capsule Take 1 capsule (30 mg total) by  mouth daily. 06/16/18   Breeback, Jade L, PA-C  sulfamethoxazole-trimethoprim (BACTRIM DS,SEPTRA DS) 800-160 MG tablet Take 1 tablet by mouth 2 (two) times daily for 7 days. 06/17/18 06/24/18  Noe Gens, PA-C    Family History Family History  Problem Relation Age of Onset  . Cancer Father        Lung and Kidney  . Heart failure Father   . Diabetes Father   . Alcohol abuse Father   . Hyperlipidemia Father   . Stroke Father     Social History Social History   Tobacco Use  . Smoking status: Current Every Day Smoker    Packs/day: 1.00    Years: 10.00    Pack years: 10.00  . Smokeless tobacco: Never Used  Substance Use Topics  . Alcohol use: No  . Drug use: No     Allergies   Biaxin [clarithromycin]; Chantix [varenicline]; Clarithromycin; Erythromycin; Penicillins; and Zithromax [azithromycin]   Review of Systems Review of Systems  Constitutional: Positive for fatigue and fever. Negative for chills.  HENT: Positive for congestion, ear pain, postnasal drip, sinus pressure, sinus pain and sore throat. Negative for trouble swallowing and voice change.   Respiratory: Positive for cough. Negative for shortness of breath.   Cardiovascular: Negative for chest pain and palpitations.  Gastrointestinal: Negative for abdominal pain, diarrhea, nausea and vomiting.  Musculoskeletal: Negative for arthralgias, back pain and myalgias.  Skin: Negative for rash.  Neurological: Positive for headaches. Negative for dizziness  and light-headedness.     Physical Exam Triage Vital Signs ED Triage Vitals [06/17/18 1554]  Enc Vitals Group     BP 108/77     Pulse Rate 80     Resp      Temp 98 F (36.7 C)     Temp Source Oral     SpO2 98 %     Weight 125 lb (56.7 kg)     Height      Head Circumference      Peak Flow      Pain Score 0     Pain Loc      Pain Edu?      Excl. in [place]?    No data found.  Updated Vital Signs BP 108/77 (BP Location: Right Arm)   Pulse 80   Temp 98 F  (36.7 C) (Oral)   Wt 125 lb (56.7 kg)   SpO2 98%   BMI 22.14 kg/m   Visual Acuity Right Eye Distance:   Left Eye Distance:   Bilateral Distance:    Right Eye Near:   Left Eye Near:    Bilateral Near:     Physical Exam  Constitutional: She is oriented to person, place, and time. She appears well-developed and well-nourished. No distress.  HENT:  Head: Normocephalic and atraumatic.  Right Ear: Tympanic membrane normal.  Left Ear: A middle ear effusion is present.  Nose: Mucosal edema present. Right sinus exhibits maxillary sinus tenderness. Right sinus exhibits no frontal sinus tenderness. Left sinus exhibits maxillary sinus tenderness. Left sinus exhibits no frontal sinus tenderness.  Mouth/Throat: Uvula is midline, oropharynx is clear and moist and mucous membranes are normal.  Eyes: EOM are normal.  Neck: Normal range of motion. Neck supple.  Cardiovascular: Normal rate and regular rhythm.  Pulmonary/Chest: Effort normal. No stridor. No respiratory distress. She has wheezes in the left upper field. She has no rales.  Musculoskeletal: Normal range of motion.  Neurological: She is alert and oriented to person, place, and time.  Skin: Skin is warm and dry. She is not diaphoretic.  Psychiatric: She has a normal mood and affect. Her behavior is normal.  Nursing note and vitals reviewed.    UC Treatments / Results  Labs (all labs ordered are listed, but only abnormal results are displayed) Labs Reviewed - No data to display  EKG None  Radiology Dg Chest 2 View  Result Date: 06/17/2018 CLINICAL DATA:  Cough, congestion, wheezing EXAM: CHEST - 2 VIEW COMPARISON:  11/22/2016 FINDINGS: The heart size and mediastinal contours are within normal limits. Both lungs are clear. The visualized skeletal structures are unremarkable. IMPRESSION: No active cardiopulmonary disease. Electronically Signed   By: Rolm Baptise M.D.   On: 06/17/2018 16:28    Procedures Procedures (including  critical care time)  Medications Ordered in UC Medications - No data to display  Initial Impression / Assessment and Plan / UC Course  I have reviewed the triage vital signs and the nursing notes.  Pertinent labs & imaging results that were available during my care of the patient were reviewed by me and considered in my medical decision making (see chart for details).     Sinus tenderness and wheeze noted on exam. Will cover for bacterial infection Encouraged sinus rinses  F/u with PCP in 1 week if needed.  Final Clinical Impressions(s) / UC Diagnoses   Final diagnoses:  Acute non-recurrent maxillary sinusitis  Wheeze     Discharge Instructions  You may take 500mg  acetaminophen every 4-6 hours or in combination with ibuprofen 400-600mg  every 6-8 hours as needed for pain, inflammation, and fever.  Be sure to drink water to stay well hydrated and get at least 8 hours of sleep at night, preferably more while sick.   Please take antibiotics as prescribed and be sure to complete entire course even if you start to feel better to ensure infection does not come back.  Please follow up with family medicine in 7-10 days if not improving.      ED Prescriptions    Medication Sig Dispense Auth. Provider   sulfamethoxazole-trimethoprim (BACTRIM DS,SEPTRA DS) 800-160 MG tablet Take 1 tablet by mouth 2 (two) times daily for 7 days. 14 tablet Noe Gens, [place]     Controlled Substance Prescriptions [place] Controlled Substance Registry consulted? Not Applicable   Tyrell Antonio 06/17/18 1732

## 2018-09-07 ENCOUNTER — Emergency Department (INDEPENDENT_AMBULATORY_CARE_PROVIDER_SITE_OTHER)
Admission: EM | Admit: 2018-09-07 | Discharge: 2018-09-07 | Disposition: A | Discharge status: Home / Self Care | Payer: Managed Care, Other (non HMO) | Source: Home / Self Care | Attending: Emergency Medicine | Admitting: Emergency Medicine

## 2018-09-07 ENCOUNTER — Other Ambulatory Visit (HOSPITAL_COMMUNITY)
Admission: RE | Admit: 2018-09-07 | Discharge: 2018-09-07 | Disposition: A | Discharge status: Home / Self Care | Payer: Managed Care, Other (non HMO) | Source: Ambulatory Visit | Attending: Emergency Medicine | Admitting: Emergency Medicine

## 2018-09-07 ENCOUNTER — Encounter: Payer: Self-pay | Admitting: Emergency Medicine

## 2018-09-07 ENCOUNTER — Other Ambulatory Visit: Payer: Self-pay

## 2018-09-07 DIAGNOSIS — N898 Other specified noninflammatory disorders of vagina: Secondary | ICD-10-CM

## 2018-09-07 DIAGNOSIS — R300 Dysuria: Secondary | ICD-10-CM

## 2018-09-07 MED ORDER — ESTRADIOL 0.1 MG/24HR TD PTTW: 1.0000 | MEDICATED_PATCH | TRANSDERMAL | 0 refills | Status: DC

## 2018-09-07 NOTE — Discharge Instructions (Addendum)
Today, your urinalysis is normal, so no evidence of urinary tract infection. We are ordering a swell self-vaginal swab, to be sent to reference lab to test for bacterial vaginosis and yeast. At your request, I am refilling the estradiol patch 0.1, until you can get into see your GYN.  I strongly advised to see your GYN in 1 week if symptoms persist more than 1 week.-In any case, you need to see your GYN to reevaluate your estradiol prescription within 4 weeks

## 2018-09-07 NOTE — ED Triage Notes (Signed)
Patient is out of Estradiol patches, called her OB/GYN and cannot get an appointment until March, needs refill. Having foul smelling urine, polyuria, drinks a lot of soda and coffee x 1 week.

## 2018-09-07 NOTE — ED Provider Notes (Signed)
[name]    CSN: [id number] Arrival date & time: 09/07/18  1506     History   Chief Complaint Chief Complaint  Patient presents with  . Polyuria    HPI Diamond Ellis is a 42 y.o. female.   HPI This is a 42 y.o. female who presents today with urinary/vaginal irritation symptoms for 5 days. She describes complex past history of TAH and BSO for endometriosis 2018, has been on estradiol patch.  Her GYN has gradually increased her estradiol patch over the months, now up to 0.1 patch twice a week.  However, she ran out of the patch because it was not sticking properly and she had to replace it several times over the past few weeks.  She feels that is the reason why she is now having menopausal symptoms, hot flashes and symptoms of atrophic vaginitis.  She states she tried to contact her GYN, but was told their office was closed until next week, with no available appointments until January. Patient complains of tiny amount of nonspecific vaginal discharge with slight odor but she is not sure if that is from her urine and requests we check urinalysis to rule out UTI. She specifically requests a 1 month refill of estradiol 0.1 patch until she can get into see her GYN + Minimal dysuria + Minimal frequency No urgency No hematuria No fever/chills No lower abdominal pain No nausea No vomiting No back pain No fatigue Has not tried over-the-counter measures.    History reviewed. No pertinent past medical history.  Patient Active Problem List   Diagnosis Date Noted  . Rosacea 09/30/2017  . 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 Anxiety 09/30/2017  . Tobacco abuse 09/30/2017  . Surgical menopause on hormone replacement therapy 09/30/2017  . Adenomatous polyp of colon 09/28/2017  . Family hx of colon cancer 09/28/2017  . RLS (restless legs syndrome) 10/30/2014  . Dry skin 10/30/2014  . Loss of weight 10/30/2014  .  Insomnia 10/30/2014  . Palpitations 10/30/2014  . Generalized anxiety disorder 10/30/2014  No history of cardiovascular problems.  Past Surgical History:  Procedure Laterality Date  . ABDOMINAL HYSTERECTOMY      OB History   No obstetric history on file.      Home Medications      Medication Sig Start Date End Date Taking? Authorizing Provider  clindamycin-benzoyl peroxide (BENZACLIN) gel Apply topically 2 (two) times daily. 09/28/17   Breeback, Jade L, PA-C  estradiol (VIVELLE-DOT) 0.1 MG/24HR patch Place 1 patch (0.1 mg total) onto the skin 2 (two) times a week.      ipratropium (ATROVENT) 0.06 % nasal spray Place 2 sprays into both nostrils 4 (four) times daily as needed for rhinitis. 06/15/18   Trixie Dredge, PA-C  lisdexamfetamine (VYVANSE) 30 MG capsule Take 1 capsule (30 mg total) by mouth daily. 06/16/18   Donella Stade, PA-C    Family History Family History  Problem Relation Age of Onset  . Cancer Father        Lung and Kidney  . Heart failure Father   . Diabetes Father   . Alcohol abuse Father   . Hyperlipidemia Father   . Stroke Father     Social History Social History   Tobacco Use  . Smoking status: Current Every Day Smoker    Packs/day: 1.00    Years: 10.00    Pack years: 10.00  . Smokeless tobacco: Never Used  Substance Use Topics  .  Alcohol use: No  . Drug use: No     Allergies   Biaxin [clarithromycin]; Chantix [varenicline]; Clarithromycin; Erythromycin; Penicillins; and Zithromax [azithromycin]   Review of Systems Review of Systems  All other systems reviewed and are negative.    Physical Exam Triage Vital Signs ED Triage Vitals  Enc Vitals Group     BP      Pulse      Resp      Temp      Temp src      SpO2      Weight      Height      Head Circumference      Peak Flow      Pain Score      Pain Loc      Pain Edu?      Excl. in [place]?    No data found.  Updated Vital Signs BP 120/78 (BP Location: Right  Arm)   Pulse 94   Temp 98 F (36.7 C) (Oral)   Ht 5\' 3"  (1.6 m)   Wt 58.5 kg   SpO2 98%   BMI 22.85 kg/m   Visual Acuity Right Eye Distance:   Left Eye Distance:   Bilateral Distance:    Right Eye Near:   Left Eye Near:    Bilateral Near:     Physical Exam Vitals signs reviewed.  Constitutional:      General: She is not in acute distress.    Appearance: She is well-developed.  HENT:     Head: Normocephalic and atraumatic.  Eyes:     General: No scleral icterus.    Pupils: Pupils are equal, round, and reactive to light.  Neck:     Musculoskeletal: Normal range of motion and neck supple.  Cardiovascular:     Rate and Rhythm: Normal rate and regular rhythm.  Pulmonary:     Effort: Pulmonary effort is normal.  Abdominal:     General: There is no distension.  Skin:    General: Skin is warm and dry.  Neurological:     Mental Status: She is alert and oriented to person, place, and time.     Cranial Nerves: No cranial nerve deficit.  Psychiatric:        Behavior: Behavior normal.    Pelvic exam deferred to her GYN  UC Treatments / Results  Labs (all labs ordered are listed, but only abnormal results are displayed) Labs Reviewed  WET PREP, GENITAL  POCT URINALYSIS DIP (MANUAL ENTRY)  CERVICOVAGINAL ANCILLARY ONLY    EKG None  Radiology No results found.  Procedures Procedures (including critical care time)  Medications Ordered in UC Medications - No data to display  Initial Impression / Assessment and Plan / UC Course  I have reviewed the triage vital signs and the nursing notes.  Pertinent labs & imaging results that were available during my care of the patient were reviewed by me and considered in my medical decision making (see chart for details).    Urinalysis today normal.  Negative for blood or nitrate or leukocytes. Final Clinical Impressions(s) / UC Diagnoses   Final diagnoses:  Vaginal odor  Dysuria  Vaginal irritation   Likely has  atrophic vaginitis and/or symptoms from lack of estrogen, as she ran out of her estradiol patch, see discussion in HPI. She denies any high risk behavior or any risk of STD.  Treatment options discussed, as well as risks, benefits, alternatives. Patient voiced understanding and agreement with the  following plans: Explained I am agreeable to refill 1 month of her estradiol patch, apply twice a week.  #8 prescribed.  No refills. Any further estradiol refill would need to be done by her GYN. She obtained a self vaginal swab to be sent for wet prep.-I am holding off treating the vaginitis empirically at this time, as hopefully the estradiol will improve atrophic vaginitis symptoms. An After Visit Summary was printed and given to the patient.-See details and discharge instructions.    Discharge Instructions     Today, your urinalysis is normal, so no evidence of urinary tract infection. We are ordering a swell self-vaginal swab, to be sent to reference lab to test for bacterial vaginosis and yeast. At your request, I am refilling the estradiol patch 0.1, until you can get into see your GYN.  I strongly advised to see your GYN in 1 week if symptoms persist more than 1 week.-In any case, you need to see your GYN to reevaluate your estradiol prescription within 4 weeks    ED Prescriptions    Medication Sig Dispense Auth. Provider   estradiol (VIVELLE-DOT) 0.1 MG/24HR patch Place 1 patch (0.1 mg total) onto the skin 2 (two) times a week. 8 patch Jacqulyn Cane, MD    #8 patches prescribed, no further refills.  Precautions discussed. Red flags discussed. Questions invited and answered. Patient voiced understanding and agreement.     Jacqulyn Cane, MD 09/07/18 [phone]

## 2018-09-12 LAB — CERVICOVAGINAL ANCILLARY ONLY
Bacterial vaginitis: NEGATIVE
Candida vaginitis: NEGATIVE
Trichomonas: NEGATIVE

## 2018-09-13 ENCOUNTER — Telehealth: Payer: Self-pay

## 2018-09-13 NOTE — Telephone Encounter (Signed)
Notified patient of lab results, will follow up as needed.

## 2018-09-28 LAB — POCT URINALYSIS DIP (MANUAL ENTRY)
Bilirubin, UA: NEGATIVE
Blood, UA: NEGATIVE
Glucose, UA: NEGATIVE mg/dL
Ketones, POC UA: NEGATIVE mg/dL
Leukocytes, UA: NEGATIVE
Nitrite, UA: NEGATIVE
Protein Ur, POC: NEGATIVE mg/dL
Spec Grav, UA: 1.02 (ref 1.010–1.025)
Urobilinogen, UA: 0.2 E.U./dL
pH, UA: 5.5 (ref 5.0–8.0)

## 2019-09-04 ENCOUNTER — Encounter: Payer: Self-pay | Admitting: Physician Assistant

## 2019-09-04 ENCOUNTER — Ambulatory Visit (INDEPENDENT_AMBULATORY_CARE_PROVIDER_SITE_OTHER): Payer: Managed Care, Other (non HMO) | Admitting: Physician Assistant

## 2019-09-04 VITALS — Ht 63.0 in | Wt 129.0 lb

## 2019-09-04 DIAGNOSIS — J0100 Acute maxillary sinusitis, unspecified: Secondary | ICD-10-CM | POA: Diagnosis not present

## 2019-09-04 DIAGNOSIS — F900 Attention-deficit hyperactivity disorder, predominantly inattentive type: Secondary | ICD-10-CM

## 2019-09-04 MED ORDER — LISDEXAMFETAMINE DIMESYLATE 20 MG PO CAPS: 20.0000 mg | ORAL_CAPSULE | ORAL | 0 refills | Status: DC

## 2019-09-04 MED ORDER — SULFAMETHOXAZOLE-TRIMETHOPRIM 800-160 MG PO TABS: 1.0000 | ORAL_TABLET | Freq: Two times a day (BID) | ORAL | 0 refills | Status: DC

## 2019-09-04 MED ORDER — LISDEXAMFETAMINE DIMESYLATE 30 MG PO CAPS: 30.0000 mg | ORAL_CAPSULE | Freq: Every day | ORAL | 0 refills | Status: DC

## 2019-09-04 NOTE — Progress Notes (Deleted)
Started last Tuesday:  Sore throat/ teeth/ ear pain Congestion, cough (productive)  No fevers, chills, body aches, no change in taste/smell  Patient will take temperature and give to [facility] at visit.

## 2019-09-04 NOTE — Progress Notes (Signed)
Patient ID: Diamond Ellis, female   DOB: [birth year], 43 y.o.   MRN: [id number]   .Marland KitchenVirtual Visit via Video Note  I connected with Diamond Ellis on 09/04/19 at  1:20 PM EST by a video enabled telemedicine application and verified that I am speaking with the correct person using two identifiers.  Location: Patient: home Provider: clinic   I discussed the limitations of evaluation and management by telemedicine and the availability of in person appointments. The patient expressed understanding and agreed to proceed.  History of Present Illness: Patient is a 43 year old female with ADHD who calls into the clinic with sinus pressure, teeth pain, sore throat for the last week.  She has not been Covid tested.  She did go ahead and start working remotely when she was symptomatic.  She is able to stay out of work through a quarantine.  She denies any loss of smell or taste, GI symptoms, fever, cough, shortness of breath.  She does feel little tired but no more than what she normally feels with her sinus infections.  She denies any direct Covid contacts or sick contacts.  She has tried mucinex with no benefit.   Pt needs vyvanse refilled. No problems or concerns. Not had in a while due to not coming in for covid.    Active Ambulatory Problems    Diagnosis Date Noted  . RLS (restless legs syndrome) 10/30/2014  . Dry skin 10/30/2014  . Loss of weight 10/30/2014  . Insomnia 10/30/2014  . Palpitations 10/30/2014  . Generalized anxiety disorder 10/30/2014  . Adenomatous polyp of colon 09/28/2017  . Family hx of colon cancer 09/28/2017  . Rosacea 09/30/2017  . 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 Anxiety 09/30/2017  . Tobacco abuse 09/30/2017  . Surgical menopause on hormone replacement therapy 09/30/2017   Resolved Ambulatory Problems    Diagnosis Date Noted  . No Resolved Ambulatory Problems   No Additional Past Medical History   Reviewed med,  allergy, problem list.    Observations/Objective: No acute distress. Normal breathing.  No cough or wheezing.  Normal mood and appearance.   .. Today's Vitals   09/04/19 1201  Weight: 129 lb (58.5 kg)  Height: 5\' 3"  (1.6 m)   Body mass index is 22.85 kg/m.    Assessment and Plan: Marland KitchenMarland KitchenAmy was seen today for sinus problem.  Diagnoses and all orders for this visit:  Acute non-recurrent maxillary sinusitis -     sulfamethoxazole-trimethoprim (BACTRIM DS) 800-160 MG tablet; Take 1 tablet by mouth 2 (two) times daily.  Attention deficit hyperactivity disorder (ADHD), predominantly inattentive type -     lisdexamfetamine (VYVANSE) 30 MG capsule; Take 1 capsule (30 mg total) by mouth daily. -     lisdexamfetamine (VYVANSE) 20 MG capsule; Take 1 capsule (20 mg total) by mouth every morning. -     lisdexamfetamine (VYVANSE) 20 MG capsule; Take 1 capsule (20 mg total) by mouth every morning.  Other orders -     Discontinue: lisdexamfetamine (VYVANSE) 20 MG capsule; Take 1 capsule (20 mg total) by mouth every morning. -     Discontinue: lisdexamfetamine (VYVANSE) 20 MG capsule; Take 1 capsule (20 mg total) by mouth every morning.  Symptoms sound consistent with a sinus infection.  She is having a lot of teeth pain.  Certainly we cannot completely rule out Covid.  Patient is willing to go ahead and do a quarantine.  From symptoms since her work allows this without being tested.  She will quarantine for 14 days from her first symptoms.  We will go ahead and treat with antibiotic for sinus infection.  Encourage use of Flonase.  Certainly if she develops more Covid-like symptoms go get tested especially if going to break quarantine.  If breathing worsens please call office or go to emergency room.  There are other things you can do for Covid symptoms please reach out.  Vyvanse restarted.  Follow-up in 3 months.   Follow Up Instructions:    I discussed the assessment and treatment plan with  the patient. The patient was provided an opportunity to ask questions and all were answered. The patient agreed with the plan and demonstrated an understanding of the instructions.   The patient was advised to call back or seek an in-person evaluation if the symptoms worsen or if the condition fails to improve as anticipated.    Iran Planas, PA-C   Started last Tuesday:  Sore throat/ teeth/ ear pain Congestion, cough (productive)  No fevers, chills, body aches, no change in taste/smell  Patient will take temperature and give to [facility] at visit.

## 2019-10-04 ENCOUNTER — Ambulatory Visit (INDEPENDENT_AMBULATORY_CARE_PROVIDER_SITE_OTHER): Payer: Managed Care, Other (non HMO) | Admitting: Nurse Practitioner

## 2019-10-04 ENCOUNTER — Encounter: Payer: Self-pay | Admitting: Nurse Practitioner

## 2019-10-04 VITALS — Temp 98.7°F

## 2019-10-04 DIAGNOSIS — J3489 Other specified disorders of nose and nasal sinuses: Secondary | ICD-10-CM | POA: Diagnosis not present

## 2019-10-04 NOTE — Progress Notes (Signed)
Virtual Visit via Video Note  I connected with Diamond Ellis on 10/04/19 at 10:50 AM EST by the video enabled telemedicine application, [employer], and verified that I am speaking with the correct person using two identifiers.   I discussed the limitations of evaluation and management by telemedicine and the availability of in person appointments. The patient expressed understanding and agreed to proceed.  The patient is: At home I am: In the office  Subjective:    CC: Sinusitis  HPI: Diamond Ellis is a 44 y.o. y/o female presenting via [place] today for continued symptoms related to recent sinusitis infection. She completed a 7 (seven) day course of Sulfamethoxazole-trimethoprim 800-160mg  on 09/12/2019. She reports that most of her symptoms have resolved, but she continues to experience fullness and "feeling of fluid" behind her ears bilaterally, as well as severe cough due to sinus drainage, worse at night. Her cough is productive and all mucous and sinus drainage is clear. The drainage and cough are keeping her up at night.   She has been taking Mucinex Fast Max Severe Congestion and Cold (Acetaminophen 325mg , Dextromethorphan hydrobromide 10mg , Guaifenesin 200mg , Phenylephrine Hydrochloride 5mg ) with no relief.   Past medical history, Surgical history, Family history not pertinant except as noted below, Social history, Allergies, and medications have been entered into the medical record, reviewed, and corrections made.   Review of Systems:  General: No fevers, chills, fatigue, night sweats, weight loss.   Neuro: No headache HEENT: No sinus pain/pressure, tooth pain, difficulty swallowing, epistaxis, loss of taste, loss of smell Pulmonary/CV: No shortness of breath, chest pain, wheezing  Objective:    General: Speaking clearly in complete sentences without any shortness of breath.  Alert and oriented x3.  Normal judgment. No apparent acute distress. She sounds congested.  Impression and  Recommendations:   1. Sinus drainage It appears she is experiencing increased sinus draining after resolution of bacterial element of sinusitis. No further antibiotic therapy appears warranted at this time. Instructions for supportive care have been provided. I recommend pseudoephedrine to decrease the mucous production and steroid nasal spray for rhinorhea. I instructed the patient that fullness behind the ears can continue for weeks. If she is still experiencing symptoms in a few days, a prednisone burst may be effective.      No orders of the defined types were placed in this encounter.     I discussed the assessment and treatment plan with the patient. The patient was provided an opportunity to ask questions and all were answered. The patient agreed with the plan and demonstrated an understanding of the instructions.   The patient was advised to call back or seek an in-person evaluation if the symptoms worsen or if the condition fails to improve as anticipated.  20 non-face-to-face time was provided during this encounter.   Orma Render, NP

## 2019-10-04 NOTE — Patient Instructions (Addendum)
Symptom Management for Sinusitis  Congestion: Guaifenesin (Mucinex)- follow directions on packaging with a maximum dose of 2400mg  in a 24 hour period.  Pain/Fever: Ibuprofen 200mg  - 400mg  every 4-6 hours as needed (MAX 1200mg  in a 24 hour period) Pain/Fever: Tylenol 500mg  -1000mg  every 6-8 hours as needed (MAX 3000mg  in a 24 hour period)  Cough: Dextromethorphan (Delsym)- follow directions on packing with a maximum dose of 120mg  in a 24 hour period.  Nasal Stuffiness: Saline nasal spray and/or Nettie Pot with sterile saline solution  Runny Nose: Fluticasone nasal spray (Flonase) OR Mometasone nasal spray (Nasonex) OR Triamcinolone Acetonide nasal spray (Nasacort)- follow directions on the packaging  Pain/Pressure: Warm washcloth to the face  Sore Throat: Warm salt water gargles  If you have allergies, you may also consider taking an oral antihistamine (like Zyrtec or Claritin) as these may also help with your symptoms.  **Many medications will have more than one ingredient, be sure you are reading the packaging carefully and not taking more than one dose of the same kind of medication at the same time or too close together. It is OK to use formulas that have all of the ingredients you want, but do not take them in a combined medication and as separate dose too close together. If you have any questions, the pharmacist will be happy to help you decide what is safe.  Ear pressure and fullness may continue for several weeks after sinusitis has resolved. There is no clear treatment for this, but some patients have reported success using an over the counter decongestant, such as pseudoephedrine (Sudafed) or phenylephrine.    Sinusitis, Adult Sinusitis is soreness and swelling (inflammation) of your sinuses. Sinuses are hollow spaces in the bones around your face. They are located:  Around your eyes.  In the middle of your forehead.  Behind your nose.  In your cheekbones. Your sinuses and  nasal passages are lined with a fluid called mucus. Mucus drains out of your sinuses. Swelling can trap mucus in your sinuses. This lets germs (bacteria, virus, or fungus) grow, which leads to infection. Most of the time, this condition is caused by a virus. What are the causes? This condition is caused by:  Allergies.  Asthma.  Germs.  Things that block your nose or sinuses.  Growths in the nose (nasal polyps).  Chemicals or irritants in the air.  Fungus (rare). What increases the risk? You are more likely to develop this condition if:  You have a weak body defense system (immune system).  You do a lot of swimming or diving.  You use nasal sprays too much.  You smoke. What are the signs or symptoms? The main symptoms of this condition are pain and a feeling of pressure around the sinuses. Other symptoms include:  Stuffy nose (congestion).  Runny nose (drainage).  Swelling and warmth in the sinuses.  Headache.  Toothache.  A cough that may get worse at night.  Mucus that collects in the throat or the back of the nose (postnasal drip).  Being unable to smell and taste.  Being very tired (fatigue).  A fever.  Sore throat.  Bad breath. How is this diagnosed? This condition is diagnosed based on:  Your symptoms.  Your medical history.  A physical exam.  Tests to find out if your condition is short-term (acute) or long-term (chronic). Your doctor may: ? Check your nose for growths (polyps). ? Check your sinuses using a tool that has a light (endoscope). ? Check  for allergies or germs. ? Do imaging tests, such as an MRI or CT scan. How is this treated? Treatment for this condition depends on the cause and whether it is short-term or long-term.  If caused by a virus, your symptoms should go away on their own within 10 days. You may be given medicines to relieve symptoms. They include: ? Medicines that shrink swollen tissue in the nose. ? Medicines that  treat allergies (antihistamines). ? A spray that treats swelling of the nostrils. ? Rinses that help get rid of thick mucus in your nose (nasal saline washes).  If caused by bacteria, your doctor may wait to see if you will get better without treatment. You may be given antibiotic medicine if you have: ? A very bad infection. ? A weak body defense system.  If caused by growths in the nose, you may need to have surgery. Follow these instructions at home: Medicines  Take, use, or apply over-the-counter and prescription medicines only as told by your doctor. These may include nasal sprays.  If you were prescribed an antibiotic medicine, take it as told by your doctor. Do not stop taking the antibiotic even if you start to feel better. Hydrate and humidify   Drink enough water to keep your pee (urine) pale yellow.  Use a cool mist humidifier to keep the humidity level in your home above 50%.  Breathe in steam for 10-15 minutes, 3-4 times a day, or as told by your doctor. You can do this in the bathroom while a hot shower is running.  Try not to spend time in cool or dry air. Rest  Rest as much as you can.  Sleep with your head raised (elevated).  Make sure you get enough sleep each night. General instructions   Put a warm, moist washcloth on your face 3-4 times a day, or as often as told by your doctor. This will help with discomfort.  Wash your hands often with soap and water. If there is no soap and water, use hand sanitizer.  Do not smoke. Avoid being around people who are smoking (secondhand smoke).  Keep all follow-up visits as told by your doctor. This is important. Contact a doctor if:  You have a fever.  Your symptoms get worse.  Your symptoms do not get better within 10 days. Get help right away if:  You have a very bad headache.  You cannot stop throwing up (vomiting).  You have very bad pain or swelling around your face or eyes.  You have trouble  seeing.  You feel confused.  Your neck is stiff.  You have trouble breathing. Summary  Sinusitis is swelling of your sinuses. Sinuses are hollow spaces in the bones around your face.  This condition is caused by tissues in your nose that become inflamed or swollen. This traps germs. These can lead to infection.  If you were prescribed an antibiotic medicine, take it as told by your doctor. Do not stop taking it even if you start to feel better.  Keep all follow-up visits as told by your doctor. This is important. This information is not intended to replace advice given to you by your health care provider. Make sure you discuss any questions you have with your health care provider. Document Revised: 02/13/2018 Document Reviewed: 02/13/2018 Elsevier Patient Education  [place].

## 2019-10-18 ENCOUNTER — Other Ambulatory Visit: Payer: Self-pay | Admitting: Nurse Practitioner

## 2019-10-18 ENCOUNTER — Telehealth: Payer: Self-pay | Admitting: Physician Assistant

## 2019-10-18 DIAGNOSIS — J3489 Other specified disorders of nose and nasal sinuses: Secondary | ICD-10-CM

## 2019-10-18 MED ORDER — PREDNISONE 20 MG PO TABS: 20.0000 mg | ORAL_TABLET | Freq: Two times a day (BID) | ORAL | 0 refills | Status: DC

## 2019-10-18 NOTE — Telephone Encounter (Signed)
Patient stated over the counter meds aren't helping and was told to notify you if it got worse.

## 2019-10-18 NOTE — Progress Notes (Signed)
Continued symptoms with sinusitis not resolved with over-the-counter medication.  5-day prednisone burst sent to pharmacy.

## 2019-10-18 NOTE — Telephone Encounter (Signed)
Left a message medication has been sent to the pharmacy, per Emeterio Reeve.

## 2020-10-02 ENCOUNTER — Emergency Department (INDEPENDENT_AMBULATORY_CARE_PROVIDER_SITE_OTHER)
Admission: EM | Admit: 2020-10-02 | Discharge: 2020-10-02 | Disposition: A | Discharge status: Home / Self Care | Payer: Managed Care, Other (non HMO) | Source: Home / Self Care

## 2020-10-02 ENCOUNTER — Other Ambulatory Visit: Payer: Self-pay

## 2020-10-02 DIAGNOSIS — R52 Pain, unspecified: Secondary | ICD-10-CM

## 2020-10-02 DIAGNOSIS — Z20822 Contact with and (suspected) exposure to covid-19: Secondary | ICD-10-CM

## 2020-10-02 DIAGNOSIS — J029 Acute pharyngitis, unspecified: Secondary | ICD-10-CM | POA: Diagnosis not present

## 2020-10-02 NOTE — ED Provider Notes (Signed)
[name]    CSN: [id number] Arrival date & time: 10/02/20  1104      History   Chief Complaint Chief Complaint  Patient presents with  . Cough  . Nasal Congestion  . Headache    HPI Diamond Ellis is a 45 y.o. female.   HPI  Diamond Ellis is a 45 y.o. female presenting to UC with c/o generalized HA for 2 days, sore throat and body aches that started yesterday. Pt reports her husband tested positive for COVID on Monday of this week, he developed symptoms last week. Pt's employer is requesting a COVID test. Pt denies fever, chills, n/v/d. She has had her COVID vaccines.    History reviewed. No pertinent past medical history.  Patient Active Problem List   Diagnosis Date Noted  . Rosacea 09/30/2017  . 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 Anxiety 09/30/2017  . Tobacco abuse 09/30/2017  . Surgical menopause on hormone replacement therapy 09/30/2017  . Adenomatous polyp of colon 09/28/2017  . Family hx of colon cancer 09/28/2017  . RLS (restless legs syndrome) 10/30/2014  . Dry skin 10/30/2014  . Loss of weight 10/30/2014  . Insomnia 10/30/2014  . Palpitations 10/30/2014  . Generalized anxiety disorder 10/30/2014    Past Surgical History:  Procedure Laterality Date  . ABDOMINAL HYSTERECTOMY      OB History   No obstetric history on file.      Home Medications    Prior to Admission medications   Medication Sig Start Date End Date Taking? Authorizing Provider  estradiol (VIVELLE-DOT) 0.1 MG/24HR patch Place 1 patch (0.1 mg total) onto the skin 2 (two) times a week. 09/07/18   Jacqulyn Cane, MD  lisdexamfetamine (VYVANSE) 20 MG capsule Take 1 capsule (20 mg total) by mouth every morning. 11/05/19   Breeback, Jade L, PA-C  lisdexamfetamine (VYVANSE) 30 MG capsule Take 1 capsule (30 mg total) by mouth daily. 10/05/19   Breeback, Royetta Car, PA-C  predniSONE (DELTASONE) 20 MG tablet Take 1 tablet (20 mg total) by  mouth 2 (two) times daily with a meal. Take one tab with breakfast and one tab with lunch. 10/18/19   Early, Coralee Pesa, NP  sulfamethoxazole-trimethoprim (BACTRIM DS) 800-160 MG tablet Take 1 tablet by mouth 2 (two) times daily. Patient not taking: Reported on 10/04/2019 09/04/19   Donella Stade, PA-C    Family History Family History  Problem Relation Age of Onset  . Cancer Father        Lung and Kidney  . Heart failure Father   . Diabetes Father   . Alcohol abuse Father   . Hyperlipidemia Father   . Stroke Father     Social History Social History   Tobacco Use  . Smoking status: Current Every Day Smoker    Packs/day: 1.00    Years: 10.00    Pack years: 10.00  . Smokeless tobacco: Never Used  Vaping Use  . Vaping Use: Never used  Substance Use Topics  . Alcohol use: No  . Drug use: No     Allergies   Biaxin [clarithromycin], Chantix [varenicline], Clarithromycin, Erythromycin, Penicillins, and Zithromax [azithromycin]   Review of Systems Review of Systems  Constitutional: Negative for chills and fever.  HENT: Positive for congestion and sore throat. Negative for ear pain, trouble swallowing and voice change.   Respiratory: Positive for cough. Negative for shortness of breath.   Cardiovascular: Negative for chest pain and palpitations.  Gastrointestinal: Negative for  abdominal pain, diarrhea, nausea and vomiting.  Musculoskeletal: Positive for arthralgias, back pain and myalgias.  Skin: Negative for rash.  Neurological: Positive for headaches. Negative for dizziness and light-headedness.  All other systems reviewed and are negative.    Physical Exam Triage Vital Signs ED Triage Vitals  Enc Vitals Group     BP 10/02/20 1116 109/76     Pulse Rate 10/02/20 1116 100     Resp 10/02/20 1116 18     Temp 10/02/20 1116 98.5 F (36.9 C)     Temp Source 10/02/20 1116 Oral     SpO2 10/02/20 1116 99 %     Weight --      Height --      Head Circumference --      Peak  Flow --      Pain Score 10/02/20 1117 6     Pain Loc --      Pain Edu? --      Excl. in GC? --    No data found.  Updated Vital Signs BP 109/76 (BP Location: Right Arm)   Pulse 100   Temp 98.5 F (36.9 C) (Oral)   Resp 18   SpO2 99%   Visual Acuity Right Eye Distance:   Left Eye Distance:   Bilateral Distance:    Right Eye Near:   Left Eye Near:    Bilateral Near:     Physical Exam Vitals and nursing note reviewed.  Constitutional:      General: She is not in acute distress.    Appearance: She is well-developed and well-nourished. She is not ill-appearing, toxic-appearing or diaphoretic.  HENT:     Head: Normocephalic and atraumatic.     Right Ear: Tympanic membrane and ear canal normal.     Left Ear: Tympanic membrane and ear canal normal.     Nose: Nose normal.     Right Sinus: No maxillary sinus tenderness or frontal sinus tenderness.     Left Sinus: No maxillary sinus tenderness or frontal sinus tenderness.     Mouth/Throat:     Lips: Pink.     Mouth: Mucous membranes are moist.     Pharynx: Oropharynx is clear. Uvula midline. No pharyngeal swelling, oropharyngeal exudate, posterior oropharyngeal erythema or uvula swelling.  Eyes:     Extraocular Movements: EOM normal.  Cardiovascular:     Rate and Rhythm: Normal rate and regular rhythm.  Pulmonary:     Effort: Pulmonary effort is normal. No respiratory distress.     Breath sounds: Normal breath sounds. No stridor. No wheezing, rhonchi or rales.  Musculoskeletal:        General: Normal range of motion.     Cervical back: Normal range of motion and neck supple.  Lymphadenopathy:     Cervical: No cervical adenopathy.  Skin:    General: Skin is warm and dry.  Neurological:     Mental Status: She is alert and oriented to person, place, and time.  Psychiatric:        Mood and Affect: Mood and affect normal.        Behavior: Behavior normal.      UC Treatments / Results  Labs (all labs ordered are  listed, but only abnormal results are displayed) Labs Reviewed  SARS-COV-2 RNA,(COVID-19) QUALITATIVE NAAT    EKG   Radiology No results found.  Procedures Procedures (including critical care time)  Medications Ordered in UC Medications - No data to display  Initial Impression / Assessment and  Plan / UC Course  I have reviewed the triage vital signs and the nursing notes.  Pertinent labs & imaging results that were available during my care of the patient were reviewed by me and considered in my medical decision making (see chart for details).     No evidence of bacterial infection at this time. COVID pending Encouraged symptomatic tx F/u with PCP as needed AVS given  Final Clinical Impressions(s) / UC Diagnoses   Final diagnoses:  Close exposure to COVID-19 virus  Acute pharyngitis, unspecified etiology  Body aches     Discharge Instructions      You may take 500mg  acetaminophen every 4-6 hours or in combination with ibuprofen 400-600mg  every 6-8 hours as needed for pain, inflammation, and fever.  Be sure to well hydrated with clear liquids and get at least 8 hours of sleep at night, preferably more while sick.   Please follow up with family medicine in 1 week if needed.     ED Prescriptions    None     PDMP not reviewed this encounter.   Noe Gens, [place] 10/04/20 [phone]

## 2020-10-02 NOTE — Discharge Instructions (Signed)
  You may take 500mg acetaminophen every 4-6 hours or in combination with ibuprofen 400-600mg every 6-8 hours as needed for pain, inflammation, and fever.  Be sure to well hydrated with clear liquids and get at least 8 hours of sleep at night, preferably more while sick.   Please follow up with family medicine in 1 week if needed.   

## 2020-10-02 NOTE — ED Triage Notes (Signed)
Pt c/o headache x 2 days. Sore throat and bodyaches started yesterday. Husband tested pos on Monday. Employer requiring test. Pt has had covid vaccines.

## 2020-10-05 LAB — SARS-COV-2 RNA,(COVID-19) QUALITATIVE NAAT: SARS CoV2 RNA: NOT DETECTED

## 2020-10-22 ENCOUNTER — Other Ambulatory Visit: Payer: Self-pay | Admitting: Physician Assistant

## 2020-10-22 DIAGNOSIS — Z1231 Encounter for screening mammogram for malignant neoplasm of breast: Secondary | ICD-10-CM

## 2020-10-23 DIAGNOSIS — Z1231 Encounter for screening mammogram for malignant neoplasm of breast: Secondary | ICD-10-CM

## 2020-10-28 ENCOUNTER — Ambulatory Visit (INDEPENDENT_AMBULATORY_CARE_PROVIDER_SITE_OTHER): Payer: Managed Care, Other (non HMO) | Admitting: Physician Assistant

## 2020-10-28 DIAGNOSIS — Z1329 Encounter for screening for other suspected endocrine disorder: Secondary | ICD-10-CM

## 2020-10-28 DIAGNOSIS — Z131 Encounter for screening for diabetes mellitus: Secondary | ICD-10-CM

## 2020-10-28 DIAGNOSIS — Z0000 Encounter for general adult medical examination without abnormal findings: Secondary | ICD-10-CM

## 2020-10-28 DIAGNOSIS — Z5329 Procedure and treatment not carried out because of patient's decision for other reasons: Secondary | ICD-10-CM

## 2020-10-28 DIAGNOSIS — Z13220 Encounter for screening for lipoid disorders: Secondary | ICD-10-CM

## 2020-10-28 NOTE — Progress Notes (Signed)
No show

## 2020-11-20 ENCOUNTER — Ambulatory Visit: Payer: Managed Care, Other (non HMO)

## 2021-02-10 ENCOUNTER — Emergency Department
Admission: EM | Admit: 2021-02-10 | Discharge: 2021-02-10 | Discharge status: Against Medical Advice | Payer: Managed Care, Other (non HMO) | Source: Home / Self Care

## 2021-02-10 ENCOUNTER — Other Ambulatory Visit: Payer: Self-pay

## 2021-02-24 ENCOUNTER — Encounter: Payer: Self-pay | Admitting: Physician Assistant

## 2021-02-24 DIAGNOSIS — Z131 Encounter for screening for diabetes mellitus: Secondary | ICD-10-CM

## 2021-02-24 DIAGNOSIS — Z13220 Encounter for screening for lipoid disorders: Secondary | ICD-10-CM

## 2021-02-24 DIAGNOSIS — Z1329 Encounter for screening for other suspected endocrine disorder: Secondary | ICD-10-CM

## 2021-02-24 DIAGNOSIS — Z0000 Encounter for general adult medical examination without abnormal findings: Secondary | ICD-10-CM

## 2021-03-31 ENCOUNTER — Telehealth: Payer: Self-pay | Admitting: General Practice

## 2021-03-31 NOTE — Telephone Encounter (Signed)
Transition Care Management Follow-up Telephone Call Date of discharge and from where: 03/28/21 from [facility] How have you been since you were released from the hospital? Doing ok. Still has numbness where she was stung. Any questions or concerns? No  Items Reviewed: Did the pt receive and understand the discharge instructions provided? Yes  Medications obtained and verified? Yes  Other? No  Any new allergies since your discharge? No  Dietary orders reviewed? Yes Do you have support at home? Yes   Home Care and Equipment/Supplies: Were home health services ordered? no   Functional Questionnaire: (I = Independent and D = Dependent) ADLs: I  Bathing/Dressing- I  Meal Prep- I  Eating- I  Maintaining continence- I  Transferring/Ambulation- I  Managing Meds- I  Follow up appointments reviewed:  PCP Hospital f/u appt confirmed? Yes  Scheduled to see Iran Planas, PA on 04/01/21 @ 0850. [facility] f/u appt confirmed? No   Are transportation arrangements needed? No  If their condition worsens, is the pt aware to call PCP or go to the Emergency Dept.? Yes Was the patient provided with contact information for the PCP's office or ED? Yes Was to pt encouraged to call back with questions or concerns? Yes

## 2021-04-01 ENCOUNTER — Ambulatory Visit (INDEPENDENT_AMBULATORY_CARE_PROVIDER_SITE_OTHER): Payer: Managed Care, Other (non HMO) | Admitting: Physician Assistant

## 2021-04-01 ENCOUNTER — Other Ambulatory Visit: Payer: Self-pay

## 2021-04-01 ENCOUNTER — Encounter: Payer: Self-pay | Admitting: Physician Assistant

## 2021-04-01 VITALS — BP 110/70 | HR 90 | Ht 63.0 in | Wt 130.0 lb

## 2021-04-01 DIAGNOSIS — Z13820 Encounter for screening for osteoporosis: Secondary | ICD-10-CM

## 2021-04-01 DIAGNOSIS — F17200 Nicotine dependence, unspecified, uncomplicated: Secondary | ICD-10-CM | POA: Diagnosis not present

## 2021-04-01 DIAGNOSIS — T63441D Toxic effect of venom of bees, accidental (unintentional), subsequent encounter: Secondary | ICD-10-CM | POA: Diagnosis not present

## 2021-04-01 DIAGNOSIS — Z90710 Acquired absence of both cervix and uterus: Secondary | ICD-10-CM

## 2021-04-01 DIAGNOSIS — Z1211 Encounter for screening for malignant neoplasm of colon: Secondary | ICD-10-CM

## 2021-04-01 DIAGNOSIS — F900 Attention-deficit hyperactivity disorder, predominantly inattentive type: Secondary | ICD-10-CM | POA: Diagnosis not present

## 2021-04-01 DIAGNOSIS — Z0000 Encounter for general adult medical examination without abnormal findings: Secondary | ICD-10-CM | POA: Diagnosis not present

## 2021-04-01 DIAGNOSIS — Z79890 Hormone replacement therapy (postmenopausal): Secondary | ICD-10-CM

## 2021-04-01 DIAGNOSIS — Z131 Encounter for screening for diabetes mellitus: Secondary | ICD-10-CM

## 2021-04-01 DIAGNOSIS — Z114 Encounter for screening for human immunodeficiency virus [HIV]: Secondary | ICD-10-CM

## 2021-04-01 DIAGNOSIS — E8940 Asymptomatic postprocedural ovarian failure: Secondary | ICD-10-CM

## 2021-04-01 DIAGNOSIS — Z13220 Encounter for screening for lipoid disorders: Secondary | ICD-10-CM

## 2021-04-01 DIAGNOSIS — Z1159 Encounter for screening for other viral diseases: Secondary | ICD-10-CM

## 2021-04-01 MED ORDER — LISDEXAMFETAMINE DIMESYLATE 30 MG PO CAPS: 30.0000 mg | ORAL_CAPSULE | Freq: Every day | ORAL | 0 refills | Status: DC

## 2021-04-01 MED ORDER — BUPROPION HCL ER (SR) 150 MG PO TB12: 150.0000 mg | ORAL_TABLET | Freq: Two times a day (BID) | ORAL | 2 refills | Status: DC

## 2021-04-01 MED ORDER — ESTRADIOL 0.1 MG/24HR TD PTTW: 1.0000 | MEDICATED_PATCH | TRANSDERMAL | 3 refills | Status: DC

## 2021-04-01 MED ORDER — METHYLPREDNISOLONE SODIUM SUCC 125 MG IJ SOLR
125.0000 mg | Freq: Once | INTRAMUSCULAR | Status: AC
  Administered 2021-04-01: 125 mg via INTRAMUSCULAR

## 2021-04-01 NOTE — Progress Notes (Signed)
.Subjective:     Diamond Ellis is a 45 y.o. female and is here for a comprehensive physical exam. The patient reports recent bee sting on 7/2 that she went to ED for and given 2 benadryl and one 40mg  of prednisone for and discharged. It is better but still her right index finger tip is swollen and painful. She in the past was given a shot and wants to know if she can have that.   She continues to smoke 1 pack a day. Failed with gums and patches.   Complete hysterectomy 2 years ago. On HRT. Needs refills.    Social History   Socioeconomic History   Marital status: Divorced    Spouse name: Not on file   Number of children: Not on file   Years of education: Not on file   Highest education level: Not on file  Occupational History   Not on file  Tobacco Use   Smoking status: Every Day    Packs/day: 1.00    Years: 10.00    Pack years: 10.00    Types: Cigarettes   Smokeless tobacco: Never  Vaping Use   Vaping Use: Never used  Substance and Sexual Activity   Alcohol use: No   Drug use: No   Sexual activity: Not on file  Other Topics Concern   Not on file  Social History Narrative   Not on file   Social Determinants of Health   Financial Resource Strain: Not on file  Food Insecurity: Not on file  Transportation Needs: Not on file  Physical Activity: Not on file  Stress: Not on file  Social Connections: Not on file  Intimate Partner Violence: Not on file   Health Maintenance  Topic Date Due   Hepatitis C Screening  Never done   MAMMOGRAM  01/19/2018   COLONOSCOPY (Pts 45-51yrs Insurance coverage will need to be confirmed)  Never done   COVID-19 Vaccine (3 - Booster for [place] series) 04/17/2021 (Originally 06/11/2020)   INFLUENZA VACCINE  04/27/2021   TETANUS/TDAP  03/20/2026   HIV Screening  Completed   HPV VACCINES  Aged Out   Pneumococcal Vaccine 73-54 Years old  Discontinued    The following portions of the patient's history were reviewed and updated as appropriate:  allergies, current medications, past family history, past medical history, past social history, past surgical history, and problem list.  Review of Systems Pertinent items noted in HPI and remainder of comprehensive ROS otherwise negative.   Objective:    BP 110/70   Pulse 90   Ht 5\' 3"  (1.6 m)   Wt 130 lb (59 kg)   SpO2 98%   BMI 23.03 kg/m  General appearance: alert, cooperative, and appears stated age Head: Normocephalic, without obvious abnormality, atraumatic Eyes: conjunctivae/corneas clear. PERRL, EOM's intact. Fundi benign. Ears: normal TM's and external ear canals both ears Nose: Nares normal. Septum midline. Mucosa normal. No drainage or sinus tenderness. Throat: lips, mucosa, and tongue normal; teeth and gums normal Neck: no adenopathy, no carotid bruit, no JVD, supple, symmetrical, trachea midline, and thyroid not enlarged, symmetric, no tenderness/mass/nodules Back: symmetric, no curvature. ROM normal. No CVA tenderness. Lungs: clear to auscultation bilaterally Heart: regular rate and rhythm, S1, S2 normal, no murmur, click, rub or gallop Abdomen: soft, non-tender; bowel sounds normal; no masses,  no organomegaly Extremities:  right index finger warm to touch and slightly erythematous and very swollen.  Pulses: 2+ and symmetric Skin: Skin color, texture, turgor normal. No rashes or  lesions Lymph nodes: Cervical, supraclavicular, and axillary nodes normal. Neurologic: Grossly normal   .. Depression screen PHQ 2/9 04/01/2021  Decreased Interest 1  Down, Depressed, Hopeless 0  PHQ - 2 Score 1  Altered sleeping 0  Tired, decreased energy 1  Change in appetite 0  Feeling bad or failure about yourself  0  Trouble concentrating 1  Moving slowly or fidgety/restless 0  Suicidal thoughts 0  PHQ-9 Score 3  Difficult doing work/chores Somewhat difficult   .Marland Kitchen GAD 7 : Generalized Anxiety Score 04/01/2021  Nervous, Anxious, on Edge 2  Control/stop worrying 1  Worry too much  - different things 1  Trouble relaxing 1  Restless 0  Easily annoyed or irritable 2  Afraid - awful might happen 0  Total GAD 7 Score 7  Anxiety Difficulty Somewhat difficult     Assessment:    Healthy female exam.      Plan:    Marland KitchenMarland KitchenAmy was seen today for annual exam.  Diagnoses and all orders for this visit:  Routine physical examination -     Lipid Panel w/reflex Direct LDL -     COMPLETE METABOLIC PANEL WITH GFR -     TSH -     CBC with Differential/Platelet  Colon cancer screening -     Ambulatory referral to Gastroenterology  Attention deficit hyperactivity disorder (ADHD), predominantly inattentive type -     lisdexamfetamine (VYVANSE) 30 MG capsule; Take 1 capsule (30 mg total) by mouth daily. -     lisdexamfetamine (VYVANSE) 30 MG capsule; Take 1 capsule (30 mg total) by mouth daily. -     lisdexamfetamine (VYVANSE) 30 MG capsule; Take 1 capsule (30 mg total) by mouth daily.  Tobacco dependence -     buPROPion (WELLBUTRIN SR) 150 MG 12 hr tablet; Take 1 tablet (150 mg total) by mouth 2 (two) times daily. -     DG Bone Density; Future  Encounter for hepatitis C screening test for low risk patient -     Hepatitis C Antibody  Screening for HIV (human immunodeficiency virus) -     HIV antibody (with reflex)  Status post hysterectomy -     DG Bone Density; Future  Osteoporosis screening -     DG Bone Density; Future  Screening for lipid disorders -     Lipid Panel w/reflex Direct LDL  Screening for diabetes mellitus -     COMPLETE METABOLIC PANEL WITH GFR  Bee sting, accidental or unintentional, subsequent encounter -     methylPREDNISolone sodium succinate (SOLU-MEDROL) 125 mg/2 mL injection 125 mg  Surgical menopause on hormone replacement therapy -     estradiol (VIVELLE-DOT) 0.1 MG/24HR patch; Place 1 patch (0.1 mg total) onto the skin 2 (two) times a week.   .. Discussed 150 minutes of exercise a week.  Encouraged vitamin D 1000 units and Calcium  1300mg  or 4 servings of dairy a day.  PHQ stable.  BP to goal.  Fasting labs ordered.  Pap not indicated.  Needs mammogram and discussed importance being on HRT.  High risk for osteoporosis ordered bone density for screening.  Colonoscopy ordered.  Covid vaccine x2 needs boosters.   Discussed smoking cessation.  Agrred to start wellbutrin SR bid. Discussed side effects and craving.  Follow up in 58months.   Solumedrol given to help with bee sting and allergic reaction.   Vyvanse for 3 months refilled.   See After Visit Summary for Counseling Recommendations

## 2021-04-01 NOTE — Patient Instructions (Signed)
Start wellbutrin twice a day for smoking cessation.  Health Maintenance, Female Adopting a healthy lifestyle and getting preventive care are important in promoting health and wellness. Ask your health care provider about: The right schedule for you to have regular tests and exams. Things you can do on your own to prevent diseases and keep yourself healthy. What should I know about diet, weight, and exercise? Eat a healthy diet  Eat a diet that includes plenty of vegetables, fruits, low-fat dairy products, and lean protein. Do not eat a lot of foods that are high in solid fats, added sugars, or sodium.  Maintain a healthy weight Body mass index (BMI) is used to identify weight problems. It estimates body fat based on height and weight. Your health care provider can help determineyour BMI and help you achieve or maintain a healthy weight. Get regular exercise Get regular exercise. This is one of the most important things you can do for your health. Most adults should: Exercise for at least 150 minutes each week. The exercise should increase your heart rate and make you sweat (moderate-intensity exercise). Do strengthening exercises at least twice a week. This is in addition to the moderate-intensity exercise. Spend less time sitting. Even light physical activity can be beneficial. Watch cholesterol and blood lipids Have your blood tested for lipids and cholesterol at 46 years of age, then havethis test every 5 years. Have your cholesterol levels checked more often if: Your lipid or cholesterol levels are high. You are older than 46 years of age. You are at high risk for heart disease. What should I know about cancer screening? Depending on your health history and family history, you may need to have cancer screening at various ages. This may include screening for: Breast cancer. Cervical cancer. Colorectal cancer. Skin cancer. Lung cancer. What should I know about heart disease,  diabetes, and high blood pressure? Blood pressure and heart disease High blood pressure causes heart disease and increases the risk of stroke. This is more likely to develop in people who have high blood pressure readings, are of African descent, or are overweight. Have your blood pressure checked: Every 3-5 years if you are 46-25 years of age. Every year if you are 2 years old or older. Diabetes Have regular diabetes screenings. This checks your fasting blood sugar level. Have the screening done: Once every three years after age 46 if you are at a normal weight and have a low risk for diabetes. More often and at a younger age if you are overweight or have a high risk for diabetes. What should I know about preventing infection? Hepatitis B If you have a higher risk for hepatitis B, you should be screened for this virus. Talk with your health care provider to find out if you are at risk forhepatitis B infection. Hepatitis C Testing is recommended for: Everyone born from [birth year] through 1966. Anyone with known risk factors for hepatitis C. Sexually transmitted infections (STIs) Get screened for STIs, including gonorrhea and chlamydia, if: You are sexually active and are younger than 46 years of age. You are older than 46 years of age and your health care provider tells you that you are at risk for this type of infection. Your sexual activity has changed since you were last screened, and you are at increased risk for chlamydia or gonorrhea. Ask your health care provider if you are at risk. Ask your health care provider about whether you are at high risk for HIV. Your health  care provider may recommend a prescription medicine to help prevent HIV infection. If you choose to take medicine to prevent HIV, you should first get tested for HIV. You should then be tested every 3 months for as long as you are taking the medicine. Pregnancy If you are about to stop having your period (premenopausal) and you  may become pregnant, seek counseling before you get pregnant. Take 400 to 800 micrograms (mcg) of folic acid every day if you become pregnant. Ask for birth control (contraception) if you want to prevent pregnancy. Osteoporosis and menopause Osteoporosis is a disease in which the bones lose minerals and strength with aging. This can result in bone fractures. If you are 46 years old or older, or if you are at 46 risk for osteoporosis and fractures, ask your health care provider if you should: Be screened for bone loss. Take a calcium or vitamin D supplement to lower your risk of fractures. Be given hormone replacement therapy (HRT) to treat symptoms of menopause. Follow these instructions at home: Lifestyle Do not use any products that contain nicotine or tobacco, such as cigarettes, e-cigarettes, and chewing tobacco. If you need help quitting, ask your health care provider. Do not use street drugs. Do not share needles. Ask your health care provider for help if you need support or information about quitting drugs. Alcohol use Do not drink alcohol if: Your health care provider tells you not to drink. You are pregnant, may be pregnant, or are planning to become pregnant. If you drink alcohol: Limit how much you use to 0-1 drink a day. Limit intake if you are breastfeeding. Be aware of how much alcohol is in your drink. In the [place] one drink equals one 12 oz bottle of beer (355 mL), one 5 oz glass of wine (148 mL), or one 1 oz glass of hard liquor (44 mL). General instructions Schedule regular health, dental, and eye exams. Stay current with your vaccines. Tell your health care provider if: You often feel depressed. You have ever been abused or do not feel safe at home. Summary Adopting a healthy lifestyle and getting preventive care are important in promoting health and wellness. Follow your health care provider's instructions about healthy diet, exercising, and getting tested or screened  for diseases. Follow your health care provider's instructions on monitoring your cholesterol and blood pressure. This information is not intended to replace advice given to you by your health care provider. Make sure you discuss any questions you have with your healthcare provider. Document Revised: 09/06/2018 Document Reviewed: 09/06/2018 Elsevier Patient Education  2022 [employer].

## 2021-04-02 LAB — COMPLETE METABOLIC PANEL WITH GFR
AG Ratio: 1.7 (calc) (ref 1.0–2.5)
ALT: 16 U/L (ref 6–29)
AST: 15 U/L (ref 10–35)
Albumin: 3.8 g/dL (ref 3.6–5.1)
Alkaline phosphatase (APISO): 63 U/L (ref 31–125)
BUN: 9 mg/dL (ref 7–25)
CO2: 28 mmol/L (ref 20–32)
Calcium: 9.5 mg/dL (ref 8.6–10.2)
Chloride: 108 mmol/L (ref 98–110)
Creat: 0.88 mg/dL (ref 0.50–1.10)
GFR, Est African American: 92 mL/min/{1.73_m2} (ref >=60)
GFR, Est Non African American: 79 mL/min/{1.73_m2} (ref >=60)
Globulin: 2.2 g/dL (calc) (ref 1.9–3.7)
Glucose, Bld: 81 mg/dL (ref 65–99)
Potassium: 4.5 mmol/L (ref 3.5–5.3)
Sodium: 141 mmol/L (ref 135–146)
Total Bilirubin: 0.4 mg/dL (ref 0.2–1.2)
Total Protein: 6 g/dL — ABNORMAL LOW (ref 6.1–8.1)

## 2021-04-02 LAB — CBC WITH DIFFERENTIAL/PLATELET
Absolute Monocytes: 412 cells/uL (ref 200–950)
Basophils Absolute: 52 cells/uL (ref 0–200)
Basophils Relative: 0.9 %
Eosinophils Absolute: 191 cells/uL (ref 15–500)
Eosinophils Relative: 3.3 %
HCT: 41.2 % (ref 35.0–45.0)
Hemoglobin: 13.7 g/dL (ref 11.7–15.5)
Lymphs Abs: 1937 cells/uL (ref 850–3900)
MCH: 30.7 pg (ref 27.0–33.0)
MCHC: 33.3 g/dL (ref 32.0–36.0)
MCV: 92.4 fL (ref 80.0–100.0)
MPV: 9.5 fL (ref 7.5–12.5)
Monocytes Relative: 7.1 %
Neutro Abs: 3207 cells/uL (ref 1500–7800)
Neutrophils Relative %: 55.3 %
Platelets: 316 10*3/uL (ref 140–400)
RBC: 4.46 10*6/uL (ref 3.80–5.10)
RDW: 11.8 % (ref 11.0–15.0)
Total Lymphocyte: 33.4 %
WBC: 5.8 10*3/uL (ref 3.8–10.8)

## 2021-04-02 LAB — LIPID PANEL W/REFLEX DIRECT LDL
Cholesterol: 228 mg/dL — ABNORMAL HIGH (ref <200)
HDL: 64 mg/dL (ref >=50)
LDL Cholesterol (Calc): 142 mg/dL (calc) — ABNORMAL HIGH
Non-HDL Cholesterol (Calc): 164 mg/dL (calc) — ABNORMAL HIGH (ref <130)
Total CHOL/HDL Ratio: 3.6 (calc) (ref <5.0)
Triglycerides: 105 mg/dL (ref <150)

## 2021-04-02 LAB — HEPATITIS C ANTIBODY
Hepatitis C Ab: NONREACTIVE
SIGNAL TO CUT-OFF: 0 (ref <1.00)

## 2021-04-02 LAB — TSH: TSH: 0.86 mIU/L

## 2021-04-02 LAB — HIV ANTIBODY (ROUTINE TESTING W REFLEX): HIV 1&2 Ab, 4th Generation: NONREACTIVE

## 2021-04-03 ENCOUNTER — Encounter: Payer: Self-pay | Admitting: Physician Assistant

## 2021-04-03 DIAGNOSIS — E7800 Pure hypercholesterolemia, unspecified: Secondary | ICD-10-CM | POA: Insufficient documentation

## 2021-04-03 NOTE — Progress Notes (Signed)
Diamond Ellis,   Hemoglobin looks great.  Thyroid normal range.  Kidney, liver, glucose are great.  Protein a little low. Increase protein in diet.  HDL, good cholesterol, looks great.  LDL, bad cholesterol, elevated and not to goal at 142 with goal being under 100. Other than smoking your risk factors are low.  Lets try to stop smoking and work on low fat diet and recheck in 1 year.

## 2021-04-04 ENCOUNTER — Encounter: Payer: Self-pay | Admitting: Physician Assistant

## 2021-04-16 LAB — HM MAMMOGRAPHY

## 2021-05-08 ENCOUNTER — Encounter: Payer: Self-pay | Admitting: Physician Assistant

## 2021-06-12 ENCOUNTER — Telehealth: Payer: Self-pay

## 2021-06-12 NOTE — Telephone Encounter (Signed)
Multiple attempts made to reach patient to R/S PV appt that patient no showed today; message left on VM- if patient does not call back by EOB today - PV and procedure appts will be cancelled and no show letter will be sent to the patient

## 2021-06-26 ENCOUNTER — Encounter: Payer: Managed Care, Other (non HMO) | Admitting: Gastroenterology

## 2021-06-29 ENCOUNTER — Encounter: Payer: Self-pay | Admitting: Gastroenterology

## 2021-06-29 ENCOUNTER — Other Ambulatory Visit: Payer: Self-pay

## 2021-06-29 ENCOUNTER — Ambulatory Visit (AMBULATORY_SURGERY_CENTER): Payer: Managed Care, Other (non HMO)

## 2021-06-29 VITALS — Ht 63.0 in | Wt 120.0 lb

## 2021-06-29 DIAGNOSIS — Z1211 Encounter for screening for malignant neoplasm of colon: Secondary | ICD-10-CM

## 2021-06-29 MED ORDER — PEG-KCL-NACL-NASULF-NA ASC-C 100 G PO SOLR: 1.0000 | Freq: Once | ORAL | 0 refills | Status: AC

## 2021-06-29 NOTE — Progress Notes (Signed)
Denies allergies to eggs or soy products. Denies complication of anesthesia or sedation. Denies use of weight loss medication. Denies use of O2.   Emmi instructions given for colonoscopy.  

## 2021-07-03 ENCOUNTER — Encounter: Payer: Self-pay | Admitting: Physician Assistant

## 2021-07-03 ENCOUNTER — Ambulatory Visit (INDEPENDENT_AMBULATORY_CARE_PROVIDER_SITE_OTHER): Payer: Managed Care, Other (non HMO) | Admitting: Physician Assistant

## 2021-07-03 ENCOUNTER — Other Ambulatory Visit: Payer: Self-pay

## 2021-07-03 VITALS — BP 123/76 | HR 104 | Ht 63.0 in | Wt 124.0 lb

## 2021-07-03 DIAGNOSIS — F439 Reaction to severe stress, unspecified: Secondary | ICD-10-CM | POA: Diagnosis not present

## 2021-07-03 DIAGNOSIS — F17200 Nicotine dependence, unspecified, uncomplicated: Secondary | ICD-10-CM

## 2021-07-03 DIAGNOSIS — F900 Attention-deficit hyperactivity disorder, predominantly inattentive type: Secondary | ICD-10-CM

## 2021-07-03 DIAGNOSIS — Z630 Problems in relationship with spouse or partner: Secondary | ICD-10-CM

## 2021-07-03 MED ORDER — LISDEXAMFETAMINE DIMESYLATE 30 MG PO CAPS: 30.0000 mg | ORAL_CAPSULE | Freq: Every day | ORAL | 0 refills | Status: DC

## 2021-07-03 MED ORDER — CLONAZEPAM 0.5 MG PO TABS: 0.5000 mg | ORAL_TABLET | Freq: Two times a day (BID) | ORAL | 1 refills | Status: DC | PRN

## 2021-07-03 NOTE — Patient Instructions (Signed)
Managing the Challenge of Quitting Smoking Quitting smoking is a physical and mental challenge. You will face cravings, withdrawal symptoms, and temptation. Before quitting, work with your health care provider to make a plan that can help you manage quitting. Preparation canhelp you quit and keep you from giving in. How to manage lifestyle changes Managing stress Stress can make you want to smoke, and wanting to smoke may cause stress. It is important to find ways to manage your stress. You might try some of the following: Practice relaxation techniques. Breathe slowly and deeply, in through your nose and out through your mouth. Listen to music. Soak in a bath or take a shower. Imagine a peaceful place or vacation. Get some support. Talk with family or friends about your stress. Join a support group. Talk with a counselor or therapist. Get some physical activity. Go for a walk, run, or bike ride. Play a favorite sport. Practice yoga.  Medicines Talk with your health care provider about medicines that might help you dealwith cravings and make quitting easier for you. Relationships Social situations can be difficult when you are quitting smoking. To manage this, you can: Avoid parties and other social situations where people might be smoking. Avoid alcohol. Leave right away if you have the urge to smoke. Explain to your family and friends that you are quitting smoking. Ask for support and let them know you might be a bit grumpy. Plan activities where smoking is not an option. General instructions Be aware that many people gain weight after they quit smoking. However, not everyone does. To keep from gaining weight, have a plan in place before you quit and stick to the plan after you quit. Your plan should include: Having healthy snacks. When you have a craving, it may help to: Eat popcorn, carrots, celery, or other cut vegetables. Chew sugar-free gum. Changing how you eat. Eat small  portion sizes at meals. Eat 4-6 small meals throughout the day instead of 1-2 large meals a day. Be mindful when you eat. Do not watch television or do other things that might distract you as you eat. Exercising regularly. Make time to exercise each day. If you do not have time for a long workout, do short bouts of exercise for 5-10 minutes several times a day. Do some form of strengthening exercise, such as weight lifting. Do some exercise that gets your heart beating and causes you to breathe deeply, such as walking fast, running, swimming, or biking. This is very important. Drinking plenty of water or other low-calorie or no-calorie drinks. Drink 6-8 glasses of water daily.  How to recognize withdrawal symptoms Your body and mind may experience discomfort as you try to get used to not having nicotine in your system. These effects are called withdrawal symptoms. They may include: Feeling hungrier than normal. Having trouble concentrating. Feeling irritable or restless. Having trouble sleeping. Feeling depressed. Craving a cigarette. To manage withdrawal symptoms: Avoid places, people, and activities that trigger your cravings. Remember why you want to quit. Get plenty of sleep. Avoid coffee and other caffeinated drinks. These may worsen some of your symptoms. These symptoms may surprise you. But be assured that they are normal to havewhen quitting smoking. How to manage cravings Come up with a plan for how to deal with your cravings. The plan should include the following: A definition of the specific situation you want to deal with. An alternative action you will take. A clear idea for how this action will help. The   name of someone who might help you with this. Cravings usually last for 5-10 minutes. Consider taking the following actions to help you with your plan to deal with cravings: Keep your mouth busy. Chew sugar-free gum. Suck on hard candies or a straw. Brush your  teeth. Keep your hands and body busy. Change to a different activity right away. Squeeze or play with a ball. Do an activity or a hobby, such as making bead jewelry, practicing needlepoint, or working with wood. Mix up your normal routine. Take a short exercise break. Go for a quick walk or run up and down stairs. Focus on doing something kind or helpful for someone else. Call a friend or family member to talk during a craving. Join a support group. Contact a quitline. Where to find support To get help or find a support group: Call the National Cancer Institute's Smoking Quitline: 1-800-QUIT NOW (784-8669) Visit the website of the Substance Abuse and Mental Health Services Administration: www.samhsa.gov Text QUIT to SmokefreeTXT: 478848 Where to find more information Visit these websites to find more information on quitting smoking: National Cancer Institute: www.smokefree.gov American Lung Association: www.lung.org American Cancer Society: www.cancer.org Centers for Disease Control and Prevention: www.cdc.gov American Heart Association: www.heart.org Contact a health care provider if: You want to change your plan for quitting. The medicines you are taking are not helping. Your eating feels out of control or you cannot sleep. Get help right away if: You feel depressed or become very anxious. Summary Quitting smoking is a physical and mental challenge. You will face cravings, withdrawal symptoms, and temptation to smoke again. Preparation can help you as you go through these challenges. Try different techniques to manage stress, handle social situations, and prevent weight gain. You can deal with cravings by keeping your mouth busy (such as by chewing gum), keeping your hands and body busy, calling family or friends, or contacting a quitline for people who want to quit smoking. You can deal with withdrawal symptoms by avoiding places where people smoke, getting plenty of rest, and  avoiding drinks with caffeine. This information is not intended to replace advice given to you by your health care provider. Make sure you discuss any questions you have with your healthcare provider. Document Revised: 07/03/2019 Document Reviewed: 07/03/2019 Elsevier Patient Education  2022 Elsevier Inc.  

## 2021-07-03 NOTE — Progress Notes (Signed)
Subjective:    Patient ID: Diamond Ellis, female    DOB: 07-06-1976, 46 y.o.   MRN: [id number]  HPI Patient is a 45 year old female with ADD, anxiety, smoker who presents to the clinic for follow-up.  Patient is doing well on Vyvanse.  She does need refills.  She denies any side effects that are concerning.  She did not like the way Wellbutrin made her feel.  She did not feel like it took away the urged to stop smoking.  She continues to smoke about a pack a day.  She is interested in quitting but she is under a lot of stress right now.  She does request some acute anxiety medicine for as needed use for some stress at home.  Her husband had an affair and they are working through it.  She has moments where she feels like she needs help.no SI/HC.    Marland Kitchen. 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   Anxiety 09/30/2017   Current smoker 09/30/2017   Surgical menopause on hormone replacement therapy 09/30/2017   Tobacco dependence 04/01/2021   Elevated LDL cholesterol level 04/03/2021   Relationship problem between partners 07/03/2021   Stress at home 07/03/2021   Resolved Ambulatory Problems    Diagnosis Date Noted   No Resolved Ambulatory Problems   Past Medical History:  Diagnosis Date   Hyperlipidemia    IBS (irritable bowel syndrome)      Review of Systems See HPI.     Objective:   Physical Exam Vitals reviewed.  Constitutional:      Appearance: Normal appearance.  HENT:     Head: Normocephalic.  Cardiovascular:     Rate and Rhythm: Normal rate.  Pulmonary:     Effort: Pulmonary effort  is normal.  Neurological:     Mental Status: She is alert.  Psychiatric:        Mood and Affect: Mood normal.      .. Depression screen [facility] 2/9 04/01/2021  Decreased Interest 1  Down, Depressed, Hopeless 0  PHQ - 2 Score 1  Altered sleeping 0  Tired, decreased energy 1  Change in appetite 0  Feeling bad or failure about yourself  0  Trouble concentrating 1  Moving slowly or fidgety/restless 0  Suicidal thoughts 0  PHQ-9 Score 3  Difficult doing work/chores Somewhat difficult   .Marland Kitchen GAD 7 : Generalized Anxiety Score 04/01/2021  Nervous, Anxious, on Edge 2  Control/stop worrying 1  Worry too much - different things 1  Trouble relaxing 1  Restless 0  Easily annoyed or irritable 2  Afraid - awful might happen 0  Total GAD 7 Score 7  Anxiety Difficulty Somewhat difficult        Assessment & Plan:   Marland KitchenMarland KitchenAmy was seen today for adhd.  Diagnoses and all orders for this visit:  Attention deficit hyperactivity disorder (ADHD), predominantly inattentive type -     lisdexamfetamine (VYVANSE) 30 MG capsule; Take 1 capsule (30 mg total) by mouth daily. -     lisdexamfetamine (VYVANSE) 30 MG capsule; Take 1 capsule (30 mg total) by mouth daily. -  lisdexamfetamine (VYVANSE) 30 MG capsule; Take 1 capsule (30 mg total) by mouth daily.  Current smoker  Relationship problem between partners -     clonazePAM (KLONOPIN) 0.5 MG tablet; Take 1 tablet (0.5 mg total) by mouth 2 (two) times daily as needed for anxiety.  Stress at home -     clonazePAM (KLONOPIN) 0.5 MG tablet; Take 1 tablet (0.5 mg total) by mouth 2 (two) times daily as needed for anxiety.  Refilled vyvanse for 3 months.  Klonapin given for as needed use. Discussed dependency use.  Encouraged counseling, self care, exercise.   Discussed smoking cessation. Try cutting back 2 cigs a week.  Discussed accupunture. HO given.   Follow up in 3 months.

## 2021-07-07 ENCOUNTER — Telehealth: Payer: Self-pay | Admitting: Gastroenterology

## 2021-07-07 NOTE — Telephone Encounter (Signed)
Patient tested positive for covid on 07/06/21. Pt has had 2 [facility] vaccines. Per [facility] covid policy pt can proceed with colonoscopy on 07/14/21. Advised pt to monitor her symptoms, and if she is not feeling well by Monday before her procedure to call [place] back to reschedule at a later date when she is feeling better. Pt verbalized understanding, and had no further concerns at the end of the call.

## 2021-07-07 NOTE — Telephone Encounter (Signed)
Patient called said she tested positive for Covid-19 yesterday and is scheduled for 07/14/21 please advise the patient.

## 2021-07-14 ENCOUNTER — Encounter: Payer: Self-pay | Admitting: Gastroenterology

## 2021-07-14 ENCOUNTER — Ambulatory Visit (AMBULATORY_SURGERY_CENTER): Payer: Managed Care, Other (non HMO) | Admitting: Gastroenterology

## 2021-07-14 VITALS — BP 111/77 | HR 76 | Temp 97.6°F | Resp 15 | Ht 63.0 in | Wt 120.0 lb

## 2021-07-14 DIAGNOSIS — D125 Benign neoplasm of sigmoid colon: Secondary | ICD-10-CM

## 2021-07-14 DIAGNOSIS — Z1211 Encounter for screening for malignant neoplasm of colon: Secondary | ICD-10-CM

## 2021-07-14 DIAGNOSIS — K5730 Diverticulosis of large intestine without perforation or abscess without bleeding: Secondary | ICD-10-CM

## 2021-07-14 DIAGNOSIS — D124 Benign neoplasm of descending colon: Secondary | ICD-10-CM

## 2021-07-14 DIAGNOSIS — K625 Hemorrhage of anus and rectum: Secondary | ICD-10-CM | POA: Diagnosis not present

## 2021-07-14 DIAGNOSIS — K641 Second degree hemorrhoids: Secondary | ICD-10-CM | POA: Diagnosis not present

## 2021-07-14 DIAGNOSIS — D128 Benign neoplasm of rectum: Secondary | ICD-10-CM

## 2021-07-14 MED ORDER — SODIUM CHLORIDE 0.9 % IV SOLN
500.0000 mL | Freq: Once | INTRAVENOUS | Status: DC
Start: 2021-07-14 — End: 2021-07-14

## 2021-07-14 NOTE — Progress Notes (Signed)
VS by CW  Pt's states no medical or surgical changes since previsit or office visit.  

## 2021-07-14 NOTE — Patient Instructions (Signed)
Handouts Provided:  Polyps  YOU HAD AN ENDOSCOPIC PROCEDURE TODAY AT THE [facility]:   Refer to the procedure report that was given to you for any specific questions about what was found during the examination.  If the procedure report does not answer your questions, please call your gastroenterologist to clarify.  If you requested that your care partner not be given the details of your procedure findings, then the procedure report has been included in a sealed envelope for you to review at your convenience later.  YOU SHOULD EXPECT: Some feelings of bloating in the abdomen. Passage of more gas than usual.  Walking can help get rid of the air that was put into your GI tract during the procedure and reduce the bloating. If you had a lower endoscopy (such as a colonoscopy or flexible sigmoidoscopy) you may notice spotting of blood in your stool or on the toilet paper. If you underwent a bowel prep for your procedure, you may not have a normal bowel movement for a few days.  Please Note:  You might notice some irritation and congestion in your nose or some drainage.  This is from the oxygen used during your procedure.  There is no need for concern and it should clear up in a day or so.  SYMPTOMS TO REPORT IMMEDIATELY:   Following lower endoscopy (colonoscopy or flexible sigmoidoscopy):  Excessive amounts of blood in the stool  Significant tenderness or worsening of abdominal pains  Swelling of the abdomen that is new, acute  Fever of 100F or higher  For urgent or emergent issues, a gastroenterologist can be reached at any hour by calling [phone]. Do not use MyChart messaging for urgent concerns.    DIET:  We do recommend a small meal at first, but then you may proceed to your regular diet.  Drink plenty of fluids but you should avoid alcoholic beverages for 24 hours.  ACTIVITY:  You should plan to take it easy for the rest of today and you should NOT DRIVE or use heavy  machinery until tomorrow (because of the sedation medicines used during the test).    FOLLOW UP: Our staff will call the number listed on your records 48-72 hours following your procedure to check on you and address any questions or concerns that you may have regarding the information given to you following your procedure. If we do not reach you, we will leave a message.  We will attempt to reach you two times.  During this call, we will ask if you have developed any symptoms of COVID 19. If you develop any symptoms (ie: fever, flu-like symptoms, shortness of breath, cough etc.) before then, please call [phone].  If you test positive for Covid 19 in the 2 weeks post procedure, please call and report this information to us.    If any biopsies were taken you will be contacted by phone or by letter within the next 1-3 weeks.  Please call us at [phone] if you have not heard about the biopsies in 3 weeks.    SIGNATURES/CONFIDENTIALITY: You and/or your care partner have signed paperwork which will be entered into your electronic medical record.  These signatures attest to the fact that that the information above on your After Visit Summary has been reviewed and is understood.  Full responsibility of the confidentiality of this discharge information lies with you and/or your care-partner.  

## 2021-07-14 NOTE — Op Note (Signed)
[facility] Patient Name: Timica Marcom Procedure Date: 07/14/2021 10:17 AM MRN: [id number] Endoscopist: Gerrit Heck , MD Age: 45 Referring MD:  Date of Birth: [birth year] Gender: Female Account #: [id number] Procedure:                Colonoscopy Indications:              Screening for colorectal malignant neoplasm                           She does report a family history notable for                            paternal grandfather and paternal aunt with colon                            cancer.                           She does report a history of intermittent blood in                            the stools. Medicines:                Monitored Anesthesia Care Procedure:                Pre-Anesthesia Assessment:                           - Prior to the procedure, a History and Physical                            was performed, and patient medications and                            allergies were reviewed. The patient's tolerance of                            previous anesthesia was also reviewed. The risks                            and benefits of the procedure and the sedation                            options and risks were discussed with the patient.                            All questions were answered, and informed consent                            was obtained. Prior Anticoagulants: The patient has                            taken no previous anticoagulant or antiplatelet  agents. ASA Grade Assessment: II - A patient with                            mild systemic disease. After reviewing the risks                            and benefits, the patient was deemed in                            satisfactory condition to undergo the procedure.                           After obtaining informed consent, the colonoscope                            was passed under direct vision. Throughout the                            procedure, the patient's  blood pressure, pulse, and                            oxygen saturations were monitored continuously. The                            PCF-HQ190L Colonoscope was introduced through the                            anus and advanced to the the cecum, identified by                            appendiceal orifice and ileocecal valve. The                            colonoscopy was performed without difficulty. The                            patient tolerated the procedure well. The quality                            of the bowel preparation was good. The ileocecal                            valve, appendiceal orifice, and rectum were                            photographed. Scope In: 10:37:46 AM Scope Out: 11:00:40 AM Scope Withdrawal Time: 0 hours 20 minutes 13 seconds  Total Procedure Duration: 0 hours 22 minutes 54 seconds  Findings:                 Hemorrhoids were found on perianal exam.                           Four sessile polyps were found in the sigmoid colon                            (  3) and descending colon (1). The polyps were 2 to                            6 mm in size. These polyps were removed with a cold                            snare. Resection and retrieval were complete.                            Estimated blood loss was minimal.                           A localized area of mildly erythematous, edematous                            mucosa was found in the sigmoid colon. No ulcers or                            erosions noted. Biopsies were taken with a cold                            forceps for histology. Estimated blood loss was                            minimal.                           Multiple small and large-mouthed diverticula were                            found in the sigmoid colon.                           Seven sessile polyps were found in the rectum. The                            polyps were 1 to 3 mm in size. These polyps were                             removed with a cold snare. Resection and retrieval                            were complete. Estimated blood loss was minimal.                           Non-bleeding internal hemorrhoids were found during                            retroflexion. The hemorrhoids were small and Grade                            II (internal hemorrhoids that prolapse but reduce  spontaneously). Complications:            No immediate complications. Estimated Blood Loss:     Estimated blood loss was minimal. Impression:               - Hemorrhoids found on perianal exam.                           - Four 2 to 6 mm polyps in the sigmoid colon and in                            the descending colon, removed with a cold snare.                            Resected and retrieved.                           - Erythematous mucosa in the sigmoid colon.                            Biopsied.                           - Diverticulosis in the sigmoid colon.                           - Seven 1 to 3 mm polyps in the rectum, removed                            with a cold snare. Resected and retrieved.                           - Non-bleeding internal hemorrhoids. Recommendation:           - Patient has a contact number available for                            emergencies. The signs and symptoms of potential                            delayed complications were discussed with the                            patient. Return to normal activities tomorrow.                            Written discharge instructions were provided to the                            patient.                           - Resume previous diet.                           - Continue present medications.                           -  Await pathology results.                           - Repeat colonoscopy for surveillance based on                            pathology results.                           - Return to GI clinic PRN.                            - Use fiber, for example Citrucel, Fibercon, Konsyl                            or Metamucil.                           - Internal hemorrhoids were noted on this study and                            may be amenable to hemorrhoid band ligation. If you                            are interested in further treatment of these                            hemorrhoids with band ligation, please contact my                            clinic to set up an appointment for evaluation and                            treatment. Gerrit Heck, MD 07/14/2021 11:09:01 AM

## 2021-07-14 NOTE — Progress Notes (Signed)
Called to room to assist during endoscopic procedure.  Patient ID and intended procedure confirmed with present staff. Received instructions for my participation in the procedure from the performing physician.  

## 2021-07-14 NOTE — Progress Notes (Signed)
GASTROENTEROLOGY PROCEDURE H&P NOTE   Primary Care Physician: Donella Stade, PA-C    Reason for Procedure:  Colon Cancer screening  Plan:    Colonoscopy  Patient is appropriate for endoscopic procedure(s) in the ambulatory ([facility]) setting.  The nature of the procedure, as well as the risks, benefits, and alternatives were carefully and thoroughly reviewed with the patient. Ample time for discussion and questions allowed. The patient understood, was satisfied, and agreed to proceed.     HPI: Diamond Ellis is a 45 y.o. female who presents for colonoscopy for routine Colon Cancer screening.  No active GI symptoms.  Fhx n/f paternal grandfather, paternal aunt with Colon Cancer. She does have long-standing hx of intermitttent hematochezia.   Past Medical History:  Diagnosis Date   Hyperlipidemia    IBS (irritable bowel syndrome)     Past Surgical History:  Procedure Laterality Date   ABDOMINAL HYSTERECTOMY     INCONTINENCE SURGERY      Prior to Admission medications   Medication Sig Start Date End Date Taking? Authorizing Provider  estradiol (VIVELLE-DOT) 0.1 MG/24HR patch Place 1 patch (0.1 mg total) onto the skin 2 (two) times a week. 04/02/21  Yes Breeback, Jade L, PA-C  lisdexamfetamine (VYVANSE) 30 MG capsule Take 1 capsule (30 mg total) by mouth daily. 08/31/21  Yes Breeback, Jade L, PA-C  clonazePAM (KLONOPIN) 0.5 MG tablet Take 1 tablet (0.5 mg total) by mouth 2 (two) times daily as needed for anxiety. Patient not taking: Reported on 07/14/2021 07/03/21   Iran Planas L, PA-C  EPINEPHrine 0.3 mg/0.3 mL IJ SOAJ injection Inject into the muscle. Patient not taking: Reported on 07/14/2021 03/28/21   [provider]    Current Outpatient Medications  Medication Sig Dispense Refill   estradiol (VIVELLE-DOT) 0.1 MG/24HR patch Place 1 patch (0.1 mg total) onto the skin 2 (two) times a week. 24 patch 3   [START ON 08/31/2021] lisdexamfetamine (VYVANSE) 30 MG capsule  Take 1 capsule (30 mg total) by mouth daily. 30 capsule 0   clonazePAM (KLONOPIN) 0.5 MG tablet Take 1 tablet (0.5 mg total) by mouth 2 (two) times daily as needed for anxiety. (Patient not taking: Reported on 07/14/2021) 20 tablet 1   EPINEPHrine 0.3 mg/0.3 mL IJ SOAJ injection Inject into the muscle. (Patient not taking: Reported on 07/14/2021)     Current Facility-Administered Medications  Medication Dose Route Frequency Provider Last Rate Last Admin   0.9 %  sodium chloride infusion  500 mL Intravenous Once Koah Chisenhall V, DO        Allergies as of 07/14/2021 - Review Complete 07/14/2021  Allergen Reaction Noted   Biaxin [clarithromycin]  10/24/2014   Chantix [varenicline]  09/28/2017   Clarithromycin  11/16/2011   Erythromycin  11/16/2011   Penicillins  11/16/2011   Wellbutrin [bupropion]  07/03/2021   Zithromax [azithromycin]  11/16/2011    Family History  Problem Relation Age of Onset   Colon cancer Father    Cancer Father        Lung and Kidney   Heart failure Father    Diabetes Father    Alcohol abuse Father    Hyperlipidemia Father    Stroke Father    Colon cancer Paternal Aunt    Colon cancer Paternal Uncle    Rectal cancer Paternal Grandfather    Esophageal cancer Neg Hx    Stomach cancer Neg Hx     Social History   Socioeconomic History   Marital status: Divorced  Spouse name: Not on file   Number of children: Not on file   Years of education: Not on file   Highest education level: Not on file  Occupational History   Not on file  Tobacco Use   Smoking status: Every Day    Packs/day: 1.00    Years: 10.00    Pack years: 10.00    Types: Cigarettes   Smokeless tobacco: Never  Vaping Use   Vaping Use: Never used  Substance and Sexual Activity   Alcohol use: No   Drug use: No   Sexual activity: Not on file  Other Topics Concern   Not on file  Social History Narrative   Not on file   Social Determinants of Health   Financial Resource  Strain: Not on file  Food Insecurity: Not on file  Transportation Needs: Not on file  Physical Activity: Not on file  Stress: Not on file  Social Connections: Not on file  Intimate Partner Violence: Not on file    Physical Exam: Vital signs in last 24 hours: @BP  119/85   Pulse 91   Temp 97.6 F (36.4 C)   Resp (!) 7   Ht 5\' 3"  (1.6 m)   Wt 120 lb (54.4 kg)   SpO2 100%   BMI 21.26 kg/m  GEN: NAD EYE: Sclerae anicteric ENT: MMM CV: Non-tachycardic Pulm: CTA b/l GI: Soft, NT/ND NEURO:  Alert & Oriented x [place], DO [facility]   07/14/2021 10:28 AM

## 2021-07-14 NOTE — Progress Notes (Signed)
To PACU, VSS. Report to RN.tb 

## 2021-07-16 ENCOUNTER — Telehealth: Payer: Self-pay | Admitting: *Deleted

## 2021-07-16 NOTE — Telephone Encounter (Signed)
  Follow up Call-  Call back number 07/14/2021  Post procedure Call Back phone  # [phone]  Permission to leave phone message Yes  Some recent data might be hidden     Patient questions:  Do you have a fever, pain , or abdominal swelling? No. Pain Score  0 *  Have you tolerated food without any problems? Yes.    Have you been able to return to your normal activities? Yes.    Do you have any questions about your discharge instructions: Diet   No. Medications  No. Follow up visit  No.  Do you have questions or concerns about your Care? No.  Actions: * If pain score is 4 or above: No action needed, pain <4. Have you developed a fever since your procedure? no  2.   Have you had an respiratory symptoms (SOB or cough) since your procedure? no  3.   Have you tested positive for COVID 19 since your procedure no  4.   Have you had any family members/close contacts diagnosed with the COVID 19 since your procedure?  no   If yes to any of these questions please route to Joylene John, RN and Joella Prince, RN  Pt states she had "small amount of blood after procedure but none since then" No other issues or instances of bleeding since then

## 2021-07-16 NOTE — Telephone Encounter (Signed)
Attempted to reach pt; no answer LMOM

## 2021-07-16 NOTE — Telephone Encounter (Signed)
  Follow up Call-  Call back number 07/14/2021  Post procedure Call Back phone  # [phone]  Permission to leave phone message Yes  Some recent data might be hidden   [facility]

## 2021-07-16 NOTE — Telephone Encounter (Signed)
Patient returned call. States she is doing well but right after the procedure she passed a lot of blood. Have not had any more blood since right after. She have had another bowel movement

## 2021-07-23 ENCOUNTER — Encounter: Payer: Self-pay | Admitting: Gastroenterology

## 2021-08-18 ENCOUNTER — Encounter: Payer: Self-pay | Admitting: Physician Assistant

## 2021-08-18 ENCOUNTER — Telehealth (INDEPENDENT_AMBULATORY_CARE_PROVIDER_SITE_OTHER): Payer: Managed Care, Other (non HMO) | Admitting: Physician Assistant

## 2021-08-18 VITALS — Temp 98.6°F

## 2021-08-18 DIAGNOSIS — J0140 Acute pansinusitis, unspecified: Secondary | ICD-10-CM

## 2021-08-18 DIAGNOSIS — Z8616 Personal history of COVID-19: Secondary | ICD-10-CM | POA: Diagnosis not present

## 2021-08-18 MED ORDER — DOXYCYCLINE HYCLATE 100 MG PO TABS: 100.0000 mg | ORAL_TABLET | Freq: Two times a day (BID) | ORAL | 0 refills | Status: DC

## 2021-08-18 MED ORDER — METHYLPREDNISOLONE 4 MG PO TBPK: ORAL_TABLET | ORAL | 0 refills | Status: DC

## 2021-08-18 NOTE — Progress Notes (Signed)
..  Virtual Visit via Video Note  I connected with Shemeika L Rinn on 08/18/21 at 11:30 AM EST by a video enabled telemedicine application and verified that I am speaking with the correct person using two identifiers.  Location: Patient: home Provider: clinic  .Marland KitchenParticipating in visit:  Patient: Via Provider: Iran Planas PA-C Provider in training: Haroldine Laws PA-S   I discussed the limitations of evaluation and management by telemedicine and the availability of in person appointments. The patient expressed understanding and agreed to proceed.  History of Present Illness: Pt is a 45 yo female who presents to the clinic with sinus pressure, congestion, headache, fatigue, cough for last 3 weeks. She tested positive for covid 3 weeks ago. She has taken dayquil, nyquil, tylenol, ibuprofen with some relief. Traveling this weekend and wants help. No labored breathing, leg pain, SOB. Cough is not too bothersome. No fever, chills, body aches. Blowing out clear to green sputum from nose. Lots of pressure.    .. 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  Pale appearance  .Marland Kitchen Today's Vitals   08/18/21 1114  Temp: 98.6 F (37 C)   There is no height or weight on file to calculate BMI.    Assessment and Plan: Marland KitchenMarland KitchenAmy was seen today for cough.  Diagnoses and all orders for this visit:  Acute non-recurrent pansinusitis -     doxycycline (VIBRA-TABS) 100 MG tablet; Take 1 tablet (100 mg total) by mouth 2 (two) times daily. For 10 days. -     methylPREDNISolone (MEDROL DOSEPAK) 4 MG TBPK tablet; Take as directed by package insert.  History of COVID-19  3 weeks after testing positive for covid.  Continues to have sinusitis symptoms.  Sent doxycycline due to PCN and azithromycin allergies.  Added medrol dose pack.  Follow up as needed or if symptoms persist.    Follow Up Instructions:    I discussed the assessment and treatment plan with the patient. The patient was provided an opportunity to ask questions and all were answered. The patient agreed with the plan and demonstrated an understanding of the instructions.   The patient was advised to call back or seek an in-person evaluation if the symptoms worsen or if the condition fails to improve as anticipated.    Iran Planas, PA-C

## 2021-08-23 LAB — CBC AND DIFFERENTIAL
HCT: 38 (ref 36–46)
Hemoglobin: 12.6 (ref 12.0–16.0)
Neutrophils Absolute: 6.9
Platelets: 288 (ref 150–399)
WBC: 12.6

## 2021-08-23 LAB — HEPATIC FUNCTION PANEL
ALT: 18 (ref 7–35)
AST: 12 — AB (ref 13–35)
Alkaline Phosphatase: 90 (ref 25–125)
Bilirubin, Total: 0.3

## 2021-08-23 LAB — CBC: RBC: 4.09 (ref 3.87–5.11)

## 2021-08-23 LAB — COMPREHENSIVE METABOLIC PANEL
Albumin: 3.3 — AB (ref 3.5–5.0)
Calcium: 8.8 (ref 8.7–10.7)

## 2021-08-23 LAB — BASIC METABOLIC PANEL
BUN: 18 (ref 4–21)
CO2: 30 — AB (ref 13–22)
Chloride: 101 (ref 99–108)
Creatinine: 0.9 (ref 0.5–1.1)
Glucose: 91
Potassium: 3.8 (ref 3.4–5.3)
Sodium: 138 (ref 137–147)

## 2021-08-23 LAB — LIPASE: Lipase: 35

## 2021-08-26 ENCOUNTER — Other Ambulatory Visit: Payer: Self-pay

## 2021-08-26 ENCOUNTER — Ambulatory Visit (INDEPENDENT_AMBULATORY_CARE_PROVIDER_SITE_OTHER): Payer: Managed Care, Other (non HMO) | Admitting: Physician Assistant

## 2021-08-26 ENCOUNTER — Encounter: Payer: Self-pay | Admitting: Physician Assistant

## 2021-08-26 VITALS — BP 110/66 | HR 89 | Temp 98.6°F | Wt 125.0 lb

## 2021-08-26 DIAGNOSIS — Z09 Encounter for follow-up examination after completed treatment for conditions other than malignant neoplasm: Secondary | ICD-10-CM | POA: Diagnosis not present

## 2021-08-26 DIAGNOSIS — R918 Other nonspecific abnormal finding of lung field: Secondary | ICD-10-CM

## 2021-08-26 DIAGNOSIS — N200 Calculus of kidney: Secondary | ICD-10-CM | POA: Insufficient documentation

## 2021-08-26 DIAGNOSIS — R1084 Generalized abdominal pain: Secondary | ICD-10-CM

## 2021-08-26 DIAGNOSIS — K5900 Constipation, unspecified: Secondary | ICD-10-CM | POA: Diagnosis not present

## 2021-08-26 DIAGNOSIS — K7689 Other specified diseases of liver: Secondary | ICD-10-CM

## 2021-08-26 LAB — POCT URINALYSIS DIP (CLINITEK)
Bilirubin, UA: NEGATIVE
Blood, UA: NEGATIVE
Glucose, UA: NEGATIVE mg/dL
Ketones, POC UA: NEGATIVE mg/dL
Leukocytes, UA: NEGATIVE
Nitrite, UA: NEGATIVE
POC PROTEIN,UA: NEGATIVE
Spec Grav, UA: 1.01 (ref 1.010–1.025)
Urobilinogen, UA: 0.2 E.U./dL
pH, UA: 7 (ref 5.0–8.0)

## 2021-08-26 MED ORDER — LINACLOTIDE 290 MCG PO CAPS
290.0000 ug | ORAL_CAPSULE | Freq: Every day | ORAL | 1 refills | Status: DC
Start: 2021-08-26 — End: 2022-10-05

## 2021-08-26 NOTE — Progress Notes (Signed)
Subjective:    Patient ID: Diamond Ellis, female    DOB: [birth year], 45 y.o.   MRN: [id number]  HPI Pt is a 45 yo female who presents to the clinic after ED visit while out of town in [place] on 08/23/2021. Pt was having abdominal pain and left flank pain. CT showed constipation, hepatic cyst, lung mass, non obstructing kidney stones. CBC/CMP unremarkable. They suspected that she had just passed a kidney stone. She was given lactulose and tramadol. She is not having great bowel movements still. No fever, chills, nausea or vomiting. She continues to have abdominal and flank pain more left than right. She is here to follow up.   .. 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   Anxiety 09/30/2017   Smoker 09/30/2017   Surgical menopause on hormone replacement therapy 09/30/2017   Tobacco dependence 04/01/2021   Elevated LDL cholesterol level 04/03/2021   Relationship problem between partners 07/03/2021   Stress at home 07/03/2021   Bacterial pneumonia 06/29/2012   Endometriosis 07/12/2017   Fever, unspecified 06/29/2012   Kidney stones 08/26/2021   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  Objective:   Physical Exam Vitals reviewed.  Constitutional:      Appearance: Normal appearance.   HENT:     Head: Normocephalic.  Cardiovascular:     Rate and Rhythm: Normal rate and regular rhythm.     Pulses: Normal pulses.  Pulmonary:     Effort: Pulmonary effort is normal.     Breath sounds: Normal breath sounds.  Abdominal:     General: Bowel sounds are normal. There is no distension.     Palpations: Abdomen is soft. There is no mass.     Tenderness: There is abdominal tenderness. There is no right CVA tenderness, left CVA tenderness, guarding or rebound.     Hernia: No hernia is present.     Comments: Generalized abdominal tenderness.   Neurological:     General: No focal deficit present.     Mental Status: She is alert.  Psychiatric:        Mood and Affect: Mood normal.         .. Results for orders placed or performed in visit on 08/26/21  Urine Culture   Specimen: Urine  Result Value Ref Range   MICRO NUMBER: [id number]    SPECIMEN QUALITY: Adequate    Sample Source NOT GIVEN    STATUS: FINAL    ISOLATE 1:      Mixed genital flora isolated. These superficial bacteria are not indicative of a urinary tract infection. No further organism identification is warranted on this specimen. If clinically indicated, recollect clean-catch, mid-stream urine and transfer  immediately to Urine Culture Transport Tube.   Urinalysis,  Routine w reflex microscopic  Result Value Ref Range   Color, Urine YELLOW YELLOW   APPearance CLEAR CLEAR   Specific Gravity, Urine 1.005 1.001 - 1.035   pH 7.0 5.0 - 8.0   Glucose, UA NEGATIVE NEGATIVE   Bilirubin Urine NEGATIVE NEGATIVE   Ketones, ur NEGATIVE NEGATIVE   Hgb urine dipstick NEGATIVE NEGATIVE   Protein, ur NEGATIVE NEGATIVE   Nitrite NEGATIVE NEGATIVE   Leukocytes,Ua NEGATIVE NEGATIVE  POCT URINALYSIS DIP (CLINITEK)  Result Value Ref Range   Color, UA light yellow (A) yellow   Clarity, UA clear clear   Glucose, UA negative negative mg/dL   Bilirubin, UA negative negative   Ketones, POC UA negative negative mg/dL   Spec  Grav, UA 1.010 1.010 - 1.025   Blood, UA negative negative   pH, UA 7.0 5.0 - 8.0   POC PROTEIN,UA negative negative, trace   Urobilinogen, UA 0.2 0.2 or 1.0 E.U./dL   Nitrite, UA Negative Negative   Leukocytes, UA Negative Negative    Assessment & Plan:  Marland KitchenMarland KitchenAmy was seen today for nephrolithiasis.  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  Generalized abdominal pain -     POCT URINALYSIS DIP (CLINITEK) -     Urinalysis, Routine w reflex microscopic -     Urine Culture -     Renal Function Panel  Hepatic cyst  UA today negative for blood, leuks, protein, nitrates.  Reassured no infection.  Pain not likely from kidney stones but rather constipation.  Work on good bowel movements.  Start linzess.  Avoid any tramadol or pain relievers. Reassurance on hepatic cyst. Consider follow up CT in 3-6 months.  CT of chest ordered for lung opacity. Pt is a smoker.  Follow up in 2 weeks.

## 2021-08-26 NOTE — Patient Instructions (Signed)

## 2021-08-28 DIAGNOSIS — K7689 Other specified diseases of liver: Secondary | ICD-10-CM | POA: Insufficient documentation

## 2021-08-28 NOTE — Progress Notes (Signed)
Urine looks great with no growth on culture.

## 2021-08-28 NOTE — Progress Notes (Signed)
Make sure taking Linzess WITH her breakfast to make it more effective. Urgent referral was made to GI for next week.

## 2021-08-28 NOTE — Addendum Note (Signed)
Addended by: Donella Stade on: 08/28/2021 02:13 PM   Modules accepted: Orders

## 2021-08-31 LAB — URINALYSIS, ROUTINE W REFLEX MICROSCOPIC
Bilirubin Urine: NEGATIVE
Glucose, UA: NEGATIVE
Hgb urine dipstick: NEGATIVE
Ketones, ur: NEGATIVE
Leukocytes,Ua: NEGATIVE
Nitrite: NEGATIVE
Protein, ur: NEGATIVE
Specific Gravity, Urine: 1.005 (ref 1.001–1.035)
pH: 7 (ref 5.0–8.0)

## 2021-08-31 LAB — URINE CULTURE
MICRO NUMBER:: 12699070
SPECIMEN QUALITY:: ADEQUATE

## 2021-08-31 LAB — RENAL FUNCTION PANEL

## 2021-10-05 ENCOUNTER — Ambulatory Visit (INDEPENDENT_AMBULATORY_CARE_PROVIDER_SITE_OTHER): Payer: Managed Care, Other (non HMO) | Admitting: Physician Assistant

## 2021-10-05 ENCOUNTER — Other Ambulatory Visit: Payer: Self-pay

## 2021-10-05 VITALS — BP 115/76 | HR 94 | Ht 63.0 in | Wt 123.0 lb

## 2021-10-05 DIAGNOSIS — Z79890 Hormone replacement therapy (postmenopausal): Secondary | ICD-10-CM

## 2021-10-05 DIAGNOSIS — K5904 Chronic idiopathic constipation: Secondary | ICD-10-CM

## 2021-10-05 DIAGNOSIS — F900 Attention-deficit hyperactivity disorder, predominantly inattentive type: Secondary | ICD-10-CM

## 2021-10-05 DIAGNOSIS — R918 Other nonspecific abnormal finding of lung field: Secondary | ICD-10-CM

## 2021-10-05 DIAGNOSIS — F17200 Nicotine dependence, unspecified, uncomplicated: Secondary | ICD-10-CM

## 2021-10-05 DIAGNOSIS — E8940 Asymptomatic postprocedural ovarian failure: Secondary | ICD-10-CM | POA: Diagnosis not present

## 2021-10-05 DIAGNOSIS — L709 Acne, unspecified: Secondary | ICD-10-CM

## 2021-10-05 DIAGNOSIS — F439 Reaction to severe stress, unspecified: Secondary | ICD-10-CM

## 2021-10-05 DIAGNOSIS — K7689 Other specified diseases of liver: Secondary | ICD-10-CM

## 2021-10-05 DIAGNOSIS — K769 Liver disease, unspecified: Secondary | ICD-10-CM

## 2021-10-05 MED ORDER — LISDEXAMFETAMINE DIMESYLATE 30 MG PO CAPS: 30.0000 mg | ORAL_CAPSULE | Freq: Every day | ORAL | 0 refills | Status: DC

## 2021-10-05 MED ORDER — ESTRADIOL 0.1 MG/24HR TD PTTW: 1.0000 | MEDICATED_PATCH | TRANSDERMAL | 3 refills | Status: DC

## 2021-10-05 MED ORDER — LISDEXAMFETAMINE DIMESYLATE 30 MG PO CAPS
30.0000 mg | ORAL_CAPSULE | Freq: Every day | ORAL | 0 refills | Status: DC
Start: 2021-11-05 — End: 2022-04-05

## 2021-10-05 MED ORDER — TRETINOIN 0.1 % EX CREA: TOPICAL_CREAM | Freq: Every day | CUTANEOUS | 3 refills | Status: DC

## 2021-10-05 NOTE — Progress Notes (Signed)
Subjective:    Patient ID: Diamond Ellis, female    DOB: 19-Oct-1975, 46 y.o.   MRN: [id number]  HPI Pt is a 46 yo female with hx of generalized abdominal pain and constipation  who presents to the clinic for follow up.   She did see GI on 12/6 and did a full cleanse and has felt complete relief since then. She was instructed to continue on linzess for daily management. She is UTD on colonoscopy.   She has not gotten CT for abdomen or chest for follow up. [facility].   Pt is doing well on her vivelle patch. No concerns but needs refills.   ADHD- no concerns. Doing well on vyvanse. No mood changes. Pt is sleeping well. She does have some increased stress at home due to an affair her husband and some fall out. She feels like once that is resolved her mood with be a lot better.   Acne needs refills of retin.   CT abomen in next month  CT chest to bapitis lung opacity/opacitis right middle lung mass seen on abdominal CT.    Review of Systems     Objective:   Physical Exam Vitals reviewed.  Constitutional:      Appearance: Normal appearance.  HENT:     Head: Normocephalic.  Neck:     Vascular: No carotid bruit.  Cardiovascular:     Rate and Rhythm: Normal rate and regular rhythm.     Pulses: Normal pulses.     Heart sounds: Normal heart sounds.  Pulmonary:     Effort: Pulmonary effort is normal.     Breath sounds: Normal breath sounds.  Abdominal:     General: Bowel sounds are normal. There is no distension.     Palpations: Abdomen is soft. There is no mass.     Tenderness: There is no abdominal tenderness. There is no right CVA tenderness, left CVA tenderness, guarding or rebound.  Neurological:     General: No focal deficit present.     Mental Status: She is alert and oriented to person, place, and time.  Psychiatric:        Mood and Affect: Mood normal.     .. Depression screen [facility] 2/9 10/05/2021 04/01/2021  Decreased Interest 1 1  Down,  Depressed, Hopeless 1 0  PHQ - 2 Score 2 1  Altered sleeping 1 0  Tired, decreased energy 1 1  Change in appetite 0 0  Feeling bad or failure about yourself  0 0  Trouble concentrating 0 1  Moving slowly or fidgety/restless 0 0  Suicidal thoughts 0 0  PHQ-9 Score 4 3  Difficult doing work/chores Somewhat difficult Somewhat difficult   .Marland Kitchen GAD 7 : Generalized Anxiety Score 10/05/2021 04/01/2021  Nervous, Anxious, on Edge 1 2  Control/stop worrying 1 1  Worry too much - different things 1 1  Trouble relaxing 1 1  Restless 0 0  Easily annoyed or irritable 1 2  Afraid - awful might happen 0 0  Total GAD 7 Score 5 7  Anxiety Difficulty Somewhat difficult Somewhat difficult         Assessment & Plan:  Marland KitchenMarland KitchenAmy was seen today for follow-up.  Diagnoses and all orders for this visit:  Chronic idiopathic constipation  Surgical menopause on hormone replacement therapy -     estradiol (VIVELLE-DOT) 0.1 MG/24HR patch; Place 1 patch (0.1 mg total) onto the skin 2 (two) times a week.  Attention deficit hyperactivity disorder (ADHD), predominantly inattentive type -     lisdexamfetamine (VYVANSE) 30 MG capsule; Take 1 capsule (30 mg total) by mouth daily. -     lisdexamfetamine (VYVANSE) 30 MG capsule; Take 1 capsule (30 mg total) by mouth daily. -     lisdexamfetamine (VYVANSE) 30 MG capsule; Take 1 capsule (30 mg total) by mouth daily.  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   Continue linzess for constipation. Next colonoscopy 2029.   Refilled vyvanse. Follow up in 6 months.   Refilled retin A for acne.   CT of abdomen and chest ordered for [facility] to follow up on lung opacities and  hepatic cyst.   Pt declined smoking cessation today.   Vivelle patch refilled. No need for pap. Mammogram UTD.   Discussed stress. Consider counseling.  PHQ and GAD no concerns. Follow up as needed.

## 2021-10-06 ENCOUNTER — Encounter: Payer: Self-pay | Admitting: Physician Assistant

## 2021-10-06 DIAGNOSIS — K5904 Chronic idiopathic constipation: Secondary | ICD-10-CM | POA: Insufficient documentation

## 2021-12-25 ENCOUNTER — Telehealth: Payer: Self-pay | Admitting: Neurology

## 2021-12-25 DIAGNOSIS — F17200 Nicotine dependence, unspecified, uncomplicated: Secondary | ICD-10-CM

## 2021-12-25 DIAGNOSIS — R918 Other nonspecific abnormal finding of lung field: Secondary | ICD-10-CM

## 2021-12-25 NOTE — Telephone Encounter (Signed)
Left vm stating she would like to discuss recent labs and imaging results. [phone]. ?

## 2021-12-28 NOTE — Telephone Encounter (Signed)
Patient had CT abdomen/pelvis at [place] in January, wants to know what kind of follow up she needs. Did not have chest CT. Last seen 10/05/2021. ? ?Procedure Note ? ?Erasmo Leventhal, MD - 10/19/2021  ?Formatting of this note might be different from the original.  ?CT ABDOMEN PELVIS W CONTRAST (ROUTINE), 10/19/2021 3:51 PM  ? ?INDICATION: \ K76.89 Hepatic cyst \ K76.9 Liver disease  ?COMPARISON: None.  ? ?TECHNIQUE: Multislice axial images were obtained through the abdomen and pelvis with administration of iodinated intravenous contrast material. Multi-planar reformatted images were generated for additional analysis. Nongated technique limits cardiac detail.  ? ?FINDINGS:  ? ?LOWER CHEST:  ?.  Mediastinum: Within normal limits.  ?.  Heart/vessel: Normal heart size. No pericardial effusion.  ?.  Lungs: Linear atelectasis in the right middle lobe and lingula.  ?.  Pleura: Within normal limits.  ? ?ABDOMEN:  ?.  Liver: 1.0 cm cystic lesion within segment 4A (series 2, image 37). 1.7 cm cystic lesion within segment 4B/5 (series 2, image 48). Additional scattered subcentimeter bilobar hypodensities are too small to characterize.  ?.  Gallbladder/biliary: Within normal limits.  ?.  Spleen: Within normal limits.  ?.  Pancreas: Within normal limits.  ?.  Adrenals: Within normal limits.  ?.  Kidneys: Within normal limits.  ?.  Peritoneum: Within normal limits.  ?.  Mesentery: Within normal limits.  ?.  Extraperitoneum: Within normal limits.  ?.  GI tract: Small bowel loops are normal in caliber. Appendix within normal limits. Scattered colonic diverticulosis.  ?.  Vascular: Aortoiliac atherosclerotic calcifications without aneurysmal dilatation.  ? ?PELVIS:  ?.  Ureters: Within normal limits.  ?.  Bladder: Within normal limits.  ?.  Reproductive system: Hysterectomy.  ? ?MSK:  ?.  Within normal limits.  ? ? ?CONCLUSION:  ? ?1.  Cystic lesions within the right and left hepatic lobes, statistically likely benign cysts  or hemangiomas.  ?2.  No acute abnormalities within the abdomen or pelvis.  ?3.  Colonic diverticulosis without evidence of diverticulitis. ?Exam End: 10/19/21 15:51   ?Specimen Collected: 10/19/21 16:02 Last Resulted: 10/19/21 16:11  ?Received From: [facility]  Result Received: 12/25/21 15:34  ?  ? ?

## 2021-12-29 NOTE — Telephone Encounter (Signed)
That one is of Abdomen/Pelvis, did you mean to have patient just have the CT chest done?  ?

## 2021-12-31 NOTE — Telephone Encounter (Signed)
Per Luvenia Starch just needs CT Chest done, then follow up.  ?

## 2021-12-31 NOTE — Telephone Encounter (Signed)
Patient made aware.  ? ?Diamond Ellis - looks like CT chest was ordered in January for [facility] but patient states she never heard from them. Can we get this sent over to them to get scheduled.  ? ?Patient will call to make a follow up appt with [facility] after CT Chest completed.  ?

## 2022-01-08 NOTE — Telephone Encounter (Signed)
Looks like previous CT chest denied by insurance. New order placed, can we see about getting insurance approval?  ?

## 2022-01-08 NOTE — Addendum Note (Signed)
Addended byAnnamaria Helling on: 01/08/2022 10:58 AM ? ? Modules accepted: Orders ? ?

## 2022-01-11 ENCOUNTER — Ambulatory Visit (INDEPENDENT_AMBULATORY_CARE_PROVIDER_SITE_OTHER): Payer: Managed Care, Other (non HMO)

## 2022-01-11 ENCOUNTER — Ambulatory Visit: Payer: Managed Care, Other (non HMO) | Admitting: Physician Assistant

## 2022-01-11 DIAGNOSIS — F17210 Nicotine dependence, cigarettes, uncomplicated: Secondary | ICD-10-CM

## 2022-01-11 DIAGNOSIS — R918 Other nonspecific abnormal finding of lung field: Secondary | ICD-10-CM | POA: Diagnosis not present

## 2022-01-11 DIAGNOSIS — F17200 Nicotine dependence, unspecified, uncomplicated: Secondary | ICD-10-CM

## 2022-01-13 ENCOUNTER — Other Ambulatory Visit: Payer: Managed Care, Other (non HMO)

## 2022-01-13 NOTE — Progress Notes (Signed)
Virtual Visit via Video Note ? ?I connected with Diamond Ellis on 01/14/22 at  9:10 AM EDT by a video enabled telemedicine application and verified that I am speaking with the correct person using two identifiers. ?  ?I discussed the limitations of evaluation and management by telemedicine and the availability of in person appointments. The patient expressed understanding and agreed to proceed. ? ?Patient location: home ?Provider locations: office ? ?Subjective:   ? ?CC: fever, sinus symptoms ? ?HPI: ?Pleasant 46 year old female presenting via MyChart video visit with reports of watery eyes, sneezing, fever, and fatigue.  Notes that she tested yesterday for COVID with positive results.  Her symptoms started on Sunday morning where she woke with a headache that was similar to a hangover headache.  She took Advil and went away.  That evening she started to experience body aches and fatigue.  She decided to rest on Monday but noted that in the afternoon, she developed a sore throat, hoarse voice, fever.  Her symptoms have continued and she awoke today with no smell or taste.  She has taken Paxlovid in the past and would like to repeat this treatment today.  Eating and drinking without difficulty and no GI symptoms.  No significant shortness of breath or chest pain. ? ?Has some questions regarding her recent CT.  Was wondering what the definitive report stated.  She is worried about her cholesterol and would like explanation on the 2 diagnoses codes that were listed at the bottom. ? ?Past medical history, Surgical history, Family history not pertinant except as noted below, Social history, Allergies, and medications have been entered into the medical record, reviewed, and corrections made.  ? ?Review of Systems: See HPI for pertinent positives and negatives.  ? ?Objective:   ? ?General: Speaking clearly in complete sentences without any shortness of breath.  Alert and oriented x3.  Normal judgment. No apparent acute  distress. ? ?Impression and Recommendations:   ? ?1. COVID-19 virus infection ?Start Paxlovid twice daily x5 days.  Continue over-the-counter symptomatic treatments with cold/flu preparations and analgesics.  Stay well-hydrated.  Quarantine per [employer] guidelines. ? ?2. Elevated LDL cholesterol level ?Reviewed her history of elevated cholesterol.  Aortic atherosclerosis was noted on her CT as well one-vessel coronary arthrosclerosis.  Advised to discuss further with her PCP regarding management of her elevated cholesterol to prevent worsening of her symptoms. ? ?3. Pulmonary emphysema, unspecified emphysema type ([place]) ?CT findings support emphysema.  Added to problem list.  Reviewed the nature of emphysema, causes, and recommendations.  Strongly recommend smoking cessation and regular intentional exercise to maintain a healthy weight.  For now, advised to work on getting well and discussed further management with her PCP. ? ?I discussed the assessment and treatment plan with the patient. The patient was provided an opportunity to ask questions and all were answered. The patient agreed with the plan and demonstrated an understanding of the instructions. ?  ?The patient was advised to call back or seek an in-person evaluation if the symptoms worsen or if the condition fails to improve as anticipated. ? ?25 minutes of non-face-to-face time was provided during this encounter. ? ?Return if symptoms worsen or fail to improve. ? ?Clearnce Sorrel, DNP, APRN, FNP-BC ?[place] [facility] ?

## 2022-01-14 ENCOUNTER — Telehealth (INDEPENDENT_AMBULATORY_CARE_PROVIDER_SITE_OTHER): Payer: Managed Care, Other (non HMO) | Admitting: Medical-Surgical

## 2022-01-14 ENCOUNTER — Encounter: Payer: Self-pay | Admitting: Medical-Surgical

## 2022-01-14 DIAGNOSIS — I700 Atherosclerosis of aorta: Secondary | ICD-10-CM | POA: Diagnosis not present

## 2022-01-14 DIAGNOSIS — J439 Emphysema, unspecified: Secondary | ICD-10-CM | POA: Diagnosis not present

## 2022-01-14 DIAGNOSIS — E7800 Pure hypercholesterolemia, unspecified: Secondary | ICD-10-CM

## 2022-01-14 DIAGNOSIS — U071 COVID-19: Secondary | ICD-10-CM | POA: Diagnosis not present

## 2022-01-14 MED ORDER — NIRMATRELVIR/RITONAVIR (PAXLOVID)TABLET: 3.0000 | ORAL_TABLET | Freq: Two times a day (BID) | ORAL | 0 refills | Status: AC

## 2022-01-14 NOTE — Addendum Note (Signed)
Addended bySamuel Bouche on: 01/14/2022 03:08 PM ? ? Modules accepted: Orders ? ?

## 2022-01-14 NOTE — Progress Notes (Signed)
No acute findings but we do need to briefly discuss findings. Can we schedule a telephone call for tomorrow or next week. Just some potential future risk to discuss.

## 2022-01-15 ENCOUNTER — Encounter: Payer: Self-pay | Admitting: Physician Assistant

## 2022-01-15 DIAGNOSIS — I2510 Atherosclerotic heart disease of native coronary artery without angina pectoris: Secondary | ICD-10-CM | POA: Insufficient documentation

## 2022-01-15 DIAGNOSIS — I7090 Unspecified atherosclerosis: Secondary | ICD-10-CM | POA: Insufficient documentation

## 2022-03-23 ENCOUNTER — Emergency Department (INDEPENDENT_AMBULATORY_CARE_PROVIDER_SITE_OTHER)
Admission: EM | Admit: 2022-03-23 | Discharge: 2022-03-23 | Disposition: A | Discharge status: Home / Self Care | Payer: Managed Care, Other (non HMO) | Source: Home / Self Care

## 2022-03-23 DIAGNOSIS — R509 Fever, unspecified: Secondary | ICD-10-CM

## 2022-03-24 ENCOUNTER — Telehealth: Payer: Managed Care, Other (non HMO) | Admitting: Physician Assistant

## 2022-03-24 DIAGNOSIS — B9689 Other specified bacterial agents as the cause of diseases classified elsewhere: Secondary | ICD-10-CM

## 2022-03-24 DIAGNOSIS — J208 Acute bronchitis due to other specified organisms: Secondary | ICD-10-CM

## 2022-03-24 DIAGNOSIS — J019 Acute sinusitis, unspecified: Secondary | ICD-10-CM | POA: Diagnosis not present

## 2022-03-24 MED ORDER — DOXYCYCLINE HYCLATE 100 MG PO TABS: 100.0000 mg | ORAL_TABLET | Freq: Two times a day (BID) | ORAL | 0 refills | Status: DC

## 2022-03-24 NOTE — Progress Notes (Signed)
Virtual Visit Consent   Diamond Ellis, you are scheduled for a virtual visit with a [facility] provider today. Just as with appointments in the office, your consent must be obtained to participate. Your consent will be active for this visit and any virtual visit you may have with one of our providers in the next 365 days. If you have a MyChart account, a copy of this consent can be sent to you electronically.  As this is a virtual visit, video technology does not allow for your provider to perform a traditional examination. This may limit your provider's ability to fully assess your condition. If your provider identifies any concerns that need to be evaluated in person or the need to arrange testing (such as labs, EKG, etc.), we will make arrangements to do so. Although advances in technology are sophisticated, we cannot ensure that it will always work on either your end or our end. If the connection with a video visit is poor, the visit may have to be switched to a telephone visit. With either a video or telephone visit, we are not always able to ensure that we have a secure connection.  By engaging in this virtual visit, you consent to the provision of healthcare and authorize for your insurance to be billed (if applicable) for the services provided during this visit. Depending on your insurance coverage, you may receive a charge related to this service.  I need to obtain your verbal consent now. Are you willing to proceed with your visit today? Diamond Ellis has provided verbal consent on 03/24/2022 for a virtual visit (video or telephone). Mar Daring, PA-C  Date: 03/24/2022 3:42 PM  Virtual Visit via Video Note   IMar Daring, connected with  Diamond Ellis  ([id number], 01-29-1976) on 03/24/22 at  3:30 PM EDT by a video-enabled telemedicine application and verified that I am speaking with the correct person using two identifiers.  Location: Patient: Virtual Visit Location Patient:  Home Provider: Virtual Visit Location Provider: Home Office   I discussed the limitations of evaluation and management by telemedicine and the availability of in person appointments. The patient expressed understanding and agreed to proceed.    History of Present Illness: Diamond Ellis is a 46 y.o. who identifies as a female who was assigned female at birth, and is being seen today for fever.  HPI: Fever  This is a new problem. The problem occurs intermittently. The problem has been gradually worsening. The maximum temperature noted was 101 to 101.9 F. The temperature was taken using an oral thermometer. Associated symptoms include congestion, coughing, ear pain, headaches and sleepiness. Pertinent negatives include no chest pain, nausea, sore throat or vomiting. She has tried acetaminophen for the symptoms. The treatment provided no relief.  Risk factors: recent sickness   Risk factors: no contaminated food, no contaminated water, no immunosuppression and no sick contacts    Was seen at Urgent Care yesterday and advised to take tylenol for fevers.    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 08/26/2021   Constipation 08/26/2021   Generalized abdominal pain 08/26/2021   Relationship problem between partners 07/03/2021  Stress at home 07/03/2021   Elevated LDL cholesterol level 04/03/2021   Tobacco dependence 04/01/2021   Rosacea 09/30/2017   Attention deficit hyperactivity disorder (ADHD), predominantly inattentive type 09/30/2017   Anxiety 09/30/2017   Smoker 09/30/2017   Surgical menopause on hormone replacement therapy 09/30/2017   Adenomatous polyp  of colon 09/28/2017   Family hx of colon cancer 09/28/2017   Endometriosis 07/12/2017   RLS (restless legs syndrome) 10/30/2014   Dry skin 10/30/2014   Loss of weight 10/30/2014   Insomnia 10/30/2014   Palpitations 10/30/2014   Generalized anxiety disorder 10/30/2014   Bacterial pneumonia 06/29/2012   Fever, unspecified 06/29/2012    Allergies:  Allergies  Allergen Reactions   Biaxin [Clarithromycin]    Chantix [Varenicline]     Crazy dream   Clarithromycin    Erythromycin    Penicillins    Wellbutrin [Bupropion]     Made her feel funny   Zithromax [Azithromycin]    Medications:  Current Outpatient Medications:    doxycycline (VIBRA-TABS) 100 MG tablet, Take 1 tablet (100 mg total) by mouth 2 (two) times daily., Disp: 20 tablet, Rfl: 0   Ascorbic Acid (VITAMIN C PO), Take by mouth daily., Disp: , Rfl:    EPINEPHrine 0.3 mg/0.3 mL IJ SOAJ injection, Inject 0.3 mg into the muscle as needed., Disp: , Rfl:    estradiol (VIVELLE-DOT) 0.1 MG/24HR patch, Place 1 patch (0.1 mg total) onto the skin 2 (two) times a week., Disp: 24 patch, Rfl: 3   linaclotide (LINZESS) 290 MCG CAPS capsule, Take 1 capsule (290 mcg total) by mouth daily before breakfast., Disp: 90 capsule, Rfl: 1   lisdexamfetamine (VYVANSE) 30 MG capsule, Take 1 capsule (30 mg total) by mouth daily., Disp: 30 capsule, Rfl: 0   lisdexamfetamine (VYVANSE) 30 MG capsule, Take 1 capsule (30 mg total) by mouth daily., Disp: 30 capsule, Rfl: 0   lisdexamfetamine (VYVANSE) 30 MG capsule, Take 1 capsule (30 mg total) by mouth daily., Disp: 30 capsule, Rfl: 0   Multiple Vitamin tablet, Take 1 tablet by mouth daily., Disp: , Rfl:    tretinoin (RETIN-A) 0.1 % cream, Apply topically at bedtime., Disp: 45 g, Rfl: 3  Observations/Objective: Patient is well-developed, well-nourished in no acute distress.  Resting comfortably at home.  Head is normocephalic, atraumatic.  No labored breathing.  Speech is clear and coherent with  logical content.  Patient is alert and oriented at baseline.  Swollen under eyes over maxillary sinus cavities Dry, barky cough heard x 1  Assessment and Plan: 1. Acute bacterial sinusitis - doxycycline (VIBRA-TABS) 100 MG tablet; Take 1 tablet (100 mg total) by mouth 2 (two) times daily.  Dispense: 20 tablet; Refill: 0  2. Acute bacterial bronchitis - doxycycline (VIBRA-TABS) 100 MG tablet; Take 1 tablet (100 mg total) by mouth 2 (two) times daily.  Dispense: 20 tablet; Refill: 0  - Worsening symptoms that have not responded to OTC medications.  - Will give Doxycycline - Continue allergy medications.  - Steam and humidifier can help - Stay well hydrated and get plenty of rest.  - Seek in person evaluation if no symptom improvement or if symptoms worsen  Follow Up Instructions: I discussed the assessment and treatment plan with the patient. The patient was provided an opportunity to ask questions and all were answered. The patient agreed with the plan and demonstrated an understanding of the instructions.  A copy of instructions were sent to the patient via MyChart unless otherwise noted below.  The patient was advised to call back or seek an in-person evaluation if the symptoms worsen or if the condition fails to improve as anticipated.  Time:  I spent 13 minutes with the patient via telehealth technology discussing the above problems/concerns.    Mar Daring, PA-C

## 2022-03-24 NOTE — Patient Instructions (Signed)
Ariany Emiliano Ellis, thank you for joining Mar Daring, PA-C for today's virtual visit.  While this provider is not your primary care provider (PCP), if your PCP is located in our provider database this encounter information will be shared with them immediately following your visit.  Consent: (Patient) Diamond Ellis provided verbal consent for this virtual visit at the beginning of the encounter.  Current Medications:  Current Outpatient Medications:    doxycycline (VIBRA-TABS) 100 MG tablet, Take 1 tablet (100 mg total) by mouth 2 (two) times daily., Disp: 20 tablet, Rfl: 0   Ascorbic Acid (VITAMIN C PO), Take by mouth daily., Disp: , Rfl:    EPINEPHrine 0.3 mg/0.3 mL IJ SOAJ injection, Inject 0.3 mg into the muscle as needed., Disp: , Rfl:    estradiol (VIVELLE-DOT) 0.1 MG/24HR patch, Place 1 patch (0.1 mg total) onto the skin 2 (two) times a week., Disp: 24 patch, Rfl: 3   linaclotide (LINZESS) 290 MCG CAPS capsule, Take 1 capsule (290 mcg total) by mouth daily before breakfast., Disp: 90 capsule, Rfl: 1   lisdexamfetamine (VYVANSE) 30 MG capsule, Take 1 capsule (30 mg total) by mouth daily., Disp: 30 capsule, Rfl: 0   lisdexamfetamine (VYVANSE) 30 MG capsule, Take 1 capsule (30 mg total) by mouth daily., Disp: 30 capsule, Rfl: 0   lisdexamfetamine (VYVANSE) 30 MG capsule, Take 1 capsule (30 mg total) by mouth daily., Disp: 30 capsule, Rfl: 0   Multiple Vitamin tablet, Take 1 tablet by mouth daily., Disp: , Rfl:    tretinoin (RETIN-A) 0.1 % cream, Apply topically at bedtime., Disp: 45 g, Rfl: 3   Medications ordered in this encounter:  Meds ordered this encounter  Medications   doxycycline (VIBRA-TABS) 100 MG tablet    Sig: Take 1 tablet (100 mg total) by mouth 2 (two) times daily.    Dispense:  20 tablet    Refill:  0    Order Specific Question:   Supervising Provider    Answer:   Sabra Heck, BRIAN [[id number]]     *If you need refills on other medications prior to your next appointment,  please contact your pharmacy*  Follow-Up: Call back or seek an in-person evaluation if the symptoms worsen or if the condition fails to improve as anticipated.  Other Instructions Sinus Infection, Adult A sinus infection is soreness and swelling (inflammation) of your sinuses. Sinuses are hollow spaces in the bones around your face. They are located: Around your eyes. In the middle of your forehead. Behind your nose. In your cheekbones. Your sinuses and nasal passages are lined with a fluid called mucus. Mucus drains out of your sinuses. Swelling can trap mucus in your sinuses. This lets germs (bacteria, virus, or fungus) grow, which leads to infection. Most of the time, this condition is caused by a virus. What are the causes? Allergies. Asthma. Germs. Things that block your nose or sinuses. Growths in the nose (nasal polyps). Chemicals or irritants in the air. A fungus. This is rare. What increases the risk? Having a weak body defense system (immune system). Doing a lot of swimming or diving. Using nasal sprays too much. Smoking. What are the signs or symptoms? The main symptoms of this condition are pain and a feeling of pressure around the sinuses. Other symptoms include: Stuffy nose (congestion). This may make it hard to breathe through your nose. Runny nose (drainage). Soreness, swelling, and warmth in the sinuses. A cough that may get worse at night. Being unable to smell and  taste. Mucus that collects in the throat or the back of the nose (postnasal drip). This may cause a sore throat or bad breath. Being very tired (fatigued). A fever. How is this diagnosed? Your symptoms. Your medical history. A physical exam. Tests to find out if your condition is short-term (acute) or long-term (chronic). Your doctor may: Check your nose for growths (polyps). Check your sinuses using a tool that has a light on one end (endoscope). Check for allergies or germs. Do imaging tests,  such as an MRI or CT scan. How is this treated? Treatment for this condition depends on the cause and whether it is short-term or long-term. If caused by a virus, your symptoms should go away on their own within 10 days. You may be given medicines to relieve symptoms. They include: Medicines that shrink swollen tissue in the nose. A spray that treats swelling of the nostrils. Rinses that help get rid of thick mucus in your nose (nasal saline washes). Medicines that treat allergies (antihistamines). Over-the-counter pain relievers. If caused by bacteria, your doctor may wait to see if you will get better without treatment. You may be given antibiotic medicine if you have: A very bad infection. A weak body defense system. If caused by growths in the nose, surgery may be needed. Follow these instructions at home: Medicines Take, use, or apply over-the-counter and prescription medicines only as told by your doctor. These may include nasal sprays. If you were prescribed an antibiotic medicine, take it as told by your doctor. Do not stop taking it even if you start to feel better. Hydrate and humidify  Drink enough water to keep your pee (urine) pale yellow. Use a cool mist humidifier to keep the humidity level in your home above 50%. Breathe in steam for 10-15 minutes, 3-4 times a day, or as told by your doctor. You can do this in the bathroom while a hot shower is running. Try not to spend time in cool or dry air. Rest Rest as much as you can. Sleep with your head raised (elevated). Make sure you get enough sleep each night. General instructions  Put a warm, moist washcloth on your face 3-4 times a day, or as often as told by your doctor. Use nasal saline washes as often as told by your doctor. Wash your hands often with soap and water. If you cannot use soap and water, use hand sanitizer. Do not smoke. Avoid being around people who are smoking (secondhand smoke). Keep all follow-up  visits. Contact a doctor if: You have a fever. Your symptoms get worse. Your symptoms do not get better within 10 days. Get help right away if: You have a very bad headache. You cannot stop vomiting. You have very bad pain or swelling around your face or eyes. You have trouble seeing. You feel confused. Your neck is stiff. You have trouble breathing. These symptoms may be an emergency. Get help right away. Call 911. Do not wait to see if the symptoms will go away. Do not drive yourself to the hospital. Summary A sinus infection is swelling of your sinuses. Sinuses are hollow spaces in the bones around your face. This condition is caused by tissues in your nose that become inflamed or swollen. This traps germs. These can lead to infection. If you were prescribed an antibiotic medicine, take it as told by your doctor. Do not stop taking it even if you start to feel better. Keep all follow-up visits. This information is not  intended to replace advice given to you by your health care provider. Make sure you discuss any questions you have with your health care provider. Document Revised: 08/18/2021 Document Reviewed: 08/18/2021 Elsevier Patient Education  [place].   Acute Bronchitis, Adult  Acute bronchitis is when air tubes in the lungs (bronchi) suddenly get swollen. The condition can make it hard for you to breathe. In adults, acute bronchitis usually goes away within 2 weeks. A cough caused by bronchitis may last up to 3 weeks. Smoking, allergies, and asthma can make the condition worse. What are the causes? Germs that cause cold and flu (viruses). The most common cause of this condition is the virus that causes the common cold. Bacteria. Substances that bother (irritate) the lungs, including: Smoke from cigarettes and other types of tobacco. Dust and pollen. Fumes from chemicals, gases, or burned fuel. Indoor or outdoor air pollution. What increases the risk? A weak  body's defense system. This is also called the immune system. Any condition that affects your lungs and breathing, such as asthma. What are the signs or symptoms? A cough. Coughing up clear, yellow, or green mucus. Making high-pitched whistling sounds when you breathe, most often when you breathe out (wheezing). Runny or stuffy nose. Having too much mucus in your lungs (chest congestion). Shortness of breath. Body aches. A sore throat. How is this treated? Acute bronchitis may go away over time without treatment. Your doctor may tell you to: Drink more fluids. This will help thin your mucus so it is easier to cough up. Use a device that gets medicine into your lungs (inhaler). Use a vaporizer or a humidifier. These are machines that add water to the air. This helps with coughing and poor breathing. Take a medicine that thins mucus and helps clear it from your lungs. Take a medicine that prevents or stops coughing. It is not common to take an antibiotic medicine for this condition. Follow these instructions at home:  Take over-the-counter and prescription medicines only as told by your doctor. Use an inhaler, vaporizer, or humidifier as told by your doctor. Take two teaspoons (10 mL) of honey at bedtime. This helps lessen your coughing at night. Drink enough fluid to keep your pee (urine) pale yellow. Do not smoke or use any products that contain nicotine or tobacco. If you need help quitting, ask your doctor. Get a lot of rest. Return to your normal activities when your doctor says that it is safe. Keep all follow-up visits. How is this prevented?  Wash your hands often with soap and water for at least 20 seconds. If you cannot use soap and water, use hand sanitizer. Avoid contact with people who have cold symptoms. Try not to touch your mouth, nose, or eyes with your hands. Avoid breathing in smoke or chemical fumes. Make sure to get the flu shot every year. Contact a doctor  if: Your symptoms do not get better in 2 weeks. You have trouble coughing up the mucus. Your cough keeps you awake at night. You have a fever. Get help right away if: You cough up blood. You have chest pain. You have very bad shortness of breath. You faint or keep feeling like you are going to faint. You have a very bad headache. Your fever or chills get worse. These symptoms may be an emergency. Get help right away. Call your local emergency services (911 in the [place] Do not wait to see if the symptoms will go away. Do not drive  yourself to the hospital. Summary Acute bronchitis is when air tubes in the lungs (bronchi) suddenly get swollen. In adults, acute bronchitis usually goes away within 2 weeks. Drink more fluids. This will help thin your mucus so it is easier to cough up. Take over-the-counter and prescription medicines only as told by your doctor. Contact a doctor if your symptoms do not improve after 2 weeks of treatment. This information is not intended to replace advice given to you by your health care provider. Make sure you discuss any questions you have with your health care provider. Document Revised: 01/14/2021 Document Reviewed: 01/14/2021 Elsevier Patient Education  [place].    If you have been instructed to have an in-person evaluation today at a local Urgent Care facility, please use the link below. It will take you to a list of all of our available [facility], including address, phone number and hours of operation. Please do not delay care.  [facility]  If you or a family member do not have a primary care provider, use the link below to schedule a visit and establish care. When you choose a [facility] primary care physician or advanced practice provider, you gain a long-term partner in health. Find a Primary Care Provider  Learn more about [facility] in-office and virtual care options: [facility]

## 2022-03-31 ENCOUNTER — Other Ambulatory Visit: Payer: Self-pay | Admitting: Physician Assistant

## 2022-03-31 DIAGNOSIS — Z90710 Acquired absence of both cervix and uterus: Secondary | ICD-10-CM

## 2022-03-31 DIAGNOSIS — Z13820 Encounter for screening for osteoporosis: Secondary | ICD-10-CM

## 2022-03-31 DIAGNOSIS — F17200 Nicotine dependence, unspecified, uncomplicated: Secondary | ICD-10-CM

## 2022-04-05 ENCOUNTER — Encounter: Payer: Self-pay | Admitting: Physician Assistant

## 2022-04-05 ENCOUNTER — Ambulatory Visit (INDEPENDENT_AMBULATORY_CARE_PROVIDER_SITE_OTHER): Payer: Managed Care, Other (non HMO) | Admitting: Physician Assistant

## 2022-04-05 VITALS — BP 111/77 | HR 80 | Ht 63.0 in | Wt 127.0 lb

## 2022-04-05 DIAGNOSIS — Z1329 Encounter for screening for other suspected endocrine disorder: Secondary | ICD-10-CM | POA: Diagnosis not present

## 2022-04-05 DIAGNOSIS — I2510 Atherosclerotic heart disease of native coronary artery without angina pectoris: Secondary | ICD-10-CM

## 2022-04-05 DIAGNOSIS — K769 Liver disease, unspecified: Secondary | ICD-10-CM

## 2022-04-05 DIAGNOSIS — F900 Attention-deficit hyperactivity disorder, predominantly inattentive type: Secondary | ICD-10-CM

## 2022-04-05 DIAGNOSIS — F17200 Nicotine dependence, unspecified, uncomplicated: Secondary | ICD-10-CM

## 2022-04-05 DIAGNOSIS — I700 Atherosclerosis of aorta: Secondary | ICD-10-CM | POA: Diagnosis not present

## 2022-04-05 DIAGNOSIS — E8940 Asymptomatic postprocedural ovarian failure: Secondary | ICD-10-CM

## 2022-04-05 DIAGNOSIS — Z79890 Hormone replacement therapy (postmenopausal): Secondary | ICD-10-CM

## 2022-04-05 DIAGNOSIS — J441 Chronic obstructive pulmonary disease with (acute) exacerbation: Secondary | ICD-10-CM

## 2022-04-05 DIAGNOSIS — J439 Emphysema, unspecified: Secondary | ICD-10-CM

## 2022-04-05 DIAGNOSIS — Z131 Encounter for screening for diabetes mellitus: Secondary | ICD-10-CM

## 2022-04-05 DIAGNOSIS — T23272A Burn of second degree of left wrist, initial encounter: Secondary | ICD-10-CM

## 2022-04-05 MED ORDER — PREDNISONE 50 MG PO TABS: ORAL_TABLET | ORAL | 0 refills | Status: DC

## 2022-04-05 MED ORDER — ESTRADIOL 1 MG PO TABS
1.0000 mg | ORAL_TABLET | Freq: Every day | ORAL | 1 refills | Status: DC
Start: 2022-04-05 — End: 2022-10-18

## 2022-04-05 MED ORDER — LISDEXAMFETAMINE DIMESYLATE 30 MG PO CAPS
30.0000 mg | ORAL_CAPSULE | Freq: Every day | ORAL | 0 refills | Status: DC
Start: 2022-05-05 — End: 2022-10-05

## 2022-04-05 MED ORDER — ATORVASTATIN CALCIUM 40 MG PO TABS: 40.0000 mg | ORAL_TABLET | Freq: Every day | ORAL | 3 refills | Status: DC

## 2022-04-05 MED ORDER — LISDEXAMFETAMINE DIMESYLATE 30 MG PO CAPS: 30.0000 mg | ORAL_CAPSULE | Freq: Every day | ORAL | 0 refills | Status: DC

## 2022-04-05 NOTE — Patient Instructions (Signed)
Start ASA '81mg'$  daily.  Start lipitor '40mg'$  daily. Stop smoking

## 2022-04-05 NOTE — Progress Notes (Signed)
Feels like needs a different dosage of estrogen  Needs refills of Vyvanse Discuss Cholesterol meds - plaque found on scan?
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
## 2022-04-13 ENCOUNTER — Telehealth: Payer: Self-pay
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
## 2022-04-15 ENCOUNTER — Encounter: Payer: Self-pay | Admitting: Physician Assistant

## 2022-04-20 ENCOUNTER — Other Ambulatory Visit (HOSPITAL_BASED_OUTPATIENT_CLINIC_OR_DEPARTMENT_OTHER): Payer: Self-pay | Admitting: Physician Assistant

## 2022-04-20 DIAGNOSIS — Z1231 Encounter for screening mammogram for malignant neoplasm of breast: Secondary | ICD-10-CM

## 2022-04-21 ENCOUNTER — Ambulatory Visit (INDEPENDENT_AMBULATORY_CARE_PROVIDER_SITE_OTHER): Payer: Managed Care, Other (non HMO)

## 2022-04-21 DIAGNOSIS — Z1231 Encounter for screening mammogram for malignant neoplasm of breast: Secondary | ICD-10-CM | POA: Diagnosis not present

## 2022-04-25 NOTE — Progress Notes (Signed)
Normal mammogram. Follow up in 1 year.

## 2022-05-03 DIAGNOSIS — T23272A Burn of second degree of left wrist, initial encounter: Secondary | ICD-10-CM | POA: Insufficient documentation

## 2022-05-05 NOTE — Telephone Encounter (Signed)
Left a detailed vm msg for patient regarding the updated visit notes. Direct call back info provided.

## 2022-10-05 ENCOUNTER — Ambulatory Visit (INDEPENDENT_AMBULATORY_CARE_PROVIDER_SITE_OTHER): Payer: Managed Care, Other (non HMO) | Admitting: Physician Assistant

## 2022-10-05 VITALS — BP 112/69 | HR 80 | Ht 63.0 in | Wt 134.0 lb

## 2022-10-05 DIAGNOSIS — E8940 Asymptomatic postprocedural ovarian failure: Secondary | ICD-10-CM

## 2022-10-05 DIAGNOSIS — Z1329 Encounter for screening for other suspected endocrine disorder: Secondary | ICD-10-CM | POA: Diagnosis not present

## 2022-10-05 DIAGNOSIS — E7800 Pure hypercholesterolemia, unspecified: Secondary | ICD-10-CM

## 2022-10-05 DIAGNOSIS — I700 Atherosclerosis of aorta: Secondary | ICD-10-CM

## 2022-10-05 DIAGNOSIS — Z131 Encounter for screening for diabetes mellitus: Secondary | ICD-10-CM | POA: Diagnosis not present

## 2022-10-05 DIAGNOSIS — B351 Tinea unguium: Secondary | ICD-10-CM

## 2022-10-05 DIAGNOSIS — N951 Menopausal and female climacteric states: Secondary | ICD-10-CM

## 2022-10-05 DIAGNOSIS — Z789 Other specified health status: Secondary | ICD-10-CM

## 2022-10-05 DIAGNOSIS — F900 Attention-deficit hyperactivity disorder, predominantly inattentive type: Secondary | ICD-10-CM | POA: Diagnosis not present

## 2022-10-05 DIAGNOSIS — Z780 Asymptomatic menopausal state: Secondary | ICD-10-CM

## 2022-10-05 MED ORDER — PITAVASTATIN CALCIUM 4 MG PO TABS: 1.0000 | ORAL_TABLET | Freq: Every day | ORAL | 5 refills | Status: DC

## 2022-10-05 MED ORDER — CICLOPIROX 8 % EX SOLN: Freq: Every day | CUTANEOUS | 0 refills | Status: DC

## 2022-10-05 MED ORDER — PROGESTERONE MICRONIZED 100 MG PO CAPS: 100.0000 mg | ORAL_CAPSULE | Freq: Every day | ORAL | 1 refills | Status: DC

## 2022-10-05 MED ORDER — LISDEXAMFETAMINE DIMESYLATE 30 MG PO CAPS: 30.0000 mg | ORAL_CAPSULE | Freq: Every day | ORAL | 0 refills | Status: DC

## 2022-10-05 NOTE — Progress Notes (Unsigned)
   Established Patient Office Visit  Subjective   Patient ID: KATHERINA WIMER, female    DOB: 1976/04/22  Age: 47 y.o. MRN: [id number]  Chief Complaint  Patient presents with   Follow-up    HPI Quite for 14 days smok Vyvanse  l   Lipitor cramps   Waking up in the middle of night with hot flashes  Hysterectomy 5 years ago nothing.  Progesteron   {History (Optional):23778}  ROS    Objective:     BP 112/69   Pulse 80   Ht '5\' 3"'$  (1.6 m)   Wt 134 lb (60.8 kg)   SpO2 97%   BMI 23.74 kg/m  {Vitals History (Optional):23777}  Physical Exam   No results found for any visits on 10/05/22.  {Labs (Optional):23779}  The 10-year ASCVD risk score (Arnett DK, et al., 2019) is: 2.3%    Assessment & Plan:   Problem List Items Addressed This Visit       Unprioritized   Elevated LDL cholesterol level - Primary   Other Visit Diagnoses     Screening for diabetes mellitus       Thyroid disorder screen           No follow-ups on file.    Iran Planas, PA-C

## 2022-10-06 ENCOUNTER — Encounter: Payer: Self-pay | Admitting: Physician Assistant

## 2022-10-06 ENCOUNTER — Encounter: Payer: Self-pay | Admitting: Neurology

## 2022-10-06 DIAGNOSIS — E559 Vitamin D deficiency, unspecified: Secondary | ICD-10-CM | POA: Insufficient documentation

## 2022-10-06 DIAGNOSIS — R748 Abnormal levels of other serum enzymes: Secondary | ICD-10-CM

## 2022-10-06 DIAGNOSIS — E8940 Asymptomatic postprocedural ovarian failure: Secondary | ICD-10-CM | POA: Insufficient documentation

## 2022-10-06 LAB — CBC WITH DIFFERENTIAL/PLATELET
Absolute Monocytes: 529 cells/uL (ref 200–950)
Basophils Absolute: 59 cells/uL (ref 0–200)
Basophils Relative: 1.1 %
Eosinophils Absolute: 140 cells/uL (ref 15–500)
Eosinophils Relative: 2.6 %
HCT: 40.3 % (ref 35.0–45.0)
Hemoglobin: 13.4 g/dL (ref 11.7–15.5)
Lymphs Abs: 2020 cells/uL (ref 850–3900)
MCH: 30.2 pg (ref 27.0–33.0)
MCHC: 33.3 g/dL (ref 32.0–36.0)
MCV: 90.8 fL (ref 80.0–100.0)
MPV: 9.2 fL (ref 7.5–12.5)
Monocytes Relative: 9.8 %
Neutro Abs: 2651 cells/uL (ref 1500–7800)
Neutrophils Relative %: 49.1 %
Platelets: 382 10*3/uL (ref 140–400)
RBC: 4.44 10*6/uL (ref 3.80–5.10)
RDW: 12.4 % (ref 11.0–15.0)
Total Lymphocyte: 37.4 %
WBC: 5.4 10*3/uL (ref 3.8–10.8)

## 2022-10-06 LAB — LIPID PANEL W/REFLEX DIRECT LDL
Cholesterol: 241 mg/dL — ABNORMAL HIGH (ref <200)
HDL: 66 mg/dL (ref >=50)
LDL Cholesterol (Calc): 151 mg/dL (calc) — ABNORMAL HIGH
Non-HDL Cholesterol (Calc): 175 mg/dL (calc) — ABNORMAL HIGH (ref <130)
Total CHOL/HDL Ratio: 3.7 (calc) (ref <5.0)
Triglycerides: 122 mg/dL (ref <150)

## 2022-10-06 LAB — COMPLETE METABOLIC PANEL WITH GFR
AG Ratio: 2.3 (calc) (ref 1.0–2.5)
ALT: 40 U/L — ABNORMAL HIGH (ref 6–29)
AST: 33 U/L (ref 10–35)
Albumin: 4.3 g/dL (ref 3.6–5.1)
Alkaline phosphatase (APISO): 76 U/L (ref 31–125)
BUN: 14 mg/dL (ref 7–25)
CO2: 29 mmol/L (ref 20–32)
Calcium: 9.2 mg/dL (ref 8.6–10.2)
Chloride: 106 mmol/L (ref 98–110)
Creat: 0.73 mg/dL (ref 0.50–0.99)
Globulin: 1.9 g/dL (calc) (ref 1.9–3.7)
Glucose, Bld: 78 mg/dL (ref 65–99)
Potassium: 4.9 mmol/L (ref 3.5–5.3)
Sodium: 142 mmol/L (ref 135–146)
Total Bilirubin: 0.4 mg/dL (ref 0.2–1.2)
Total Protein: 6.2 g/dL (ref 6.1–8.1)
eGFR: 102 mL/min/{1.73_m2} (ref >=60)

## 2022-10-06 LAB — ESTRADIOL: Estradiol: <15 pg/mL

## 2022-10-06 LAB — VITAMIN D 25 HYDROXY (VIT D DEFICIENCY, FRACTURES): Vit D, 25-Hydroxy: 17 ng/mL — ABNORMAL LOW (ref 30–100)

## 2022-10-06 LAB — TSH: TSH: 1.84 mIU/L

## 2022-10-06 NOTE — Progress Notes (Signed)
Missi,   LDL up.  Trial of livalo to see if you tolerate. HDL looks good.   Kidney looks great.  ALT up some. Avoid tylenol and alcohol and recheck in 2 weeks to make sure not trending up.  Estradiol is pretty low. We could consider an increase in estrogen as well. You are taking '1mg'$  daily? If we did this it is important that you stay smoke free!  Thyroid looks great.  Vitamin D is really low. Need to take 2000 units with dairy of D3 daily.

## 2022-10-16 ENCOUNTER — Other Ambulatory Visit: Payer: Self-pay | Admitting: Physician Assistant

## 2022-10-16 DIAGNOSIS — E8940 Asymptomatic postprocedural ovarian failure: Secondary | ICD-10-CM

## 2022-11-10 ENCOUNTER — Encounter: Payer: Self-pay | Admitting: Family Medicine

## 2022-11-10 ENCOUNTER — Telehealth: Payer: Managed Care, Other (non HMO) | Admitting: Family Medicine

## 2022-11-10 VITALS — Temp 101.0°F | Ht 63.0 in | Wt 134.0 lb

## 2022-11-10 DIAGNOSIS — B9689 Other specified bacterial agents as the cause of diseases classified elsewhere: Secondary | ICD-10-CM | POA: Diagnosis not present

## 2022-11-10 DIAGNOSIS — J019 Acute sinusitis, unspecified: Secondary | ICD-10-CM | POA: Diagnosis not present

## 2022-11-10 MED ORDER — DOXYCYCLINE HYCLATE 100 MG PO TABS: 100.0000 mg | ORAL_TABLET | Freq: Two times a day (BID) | ORAL | 0 refills | Status: DC

## 2022-11-10 NOTE — Assessment & Plan Note (Addendum)
Sinus pressure, nasal congestion, fever.  Tmax 101.0.  Symptoms consistent with acute sinusitis.  Denies chest pain, chills, shortness of breath, cough.  She has tried rest and vitamin C and Advil which have not been effective in relieving her symptoms.  Patient was exposed in a large group meeting where everyone was sick.  Symptoms have been present for 4 days.  Recommend sinus rinses, supportive therapy, doxycycline 100 mg twice daily for 10 days.  She will follow-up if symptoms do not improve.

## 2022-11-10 NOTE — Progress Notes (Signed)
Established Patient Office Visit I connected with  Diamond Ellis on 11/10/22 by a video enabled telemedicine application and verified that I am speaking with the correct person using two identifiers.   I discussed the limitations of evaluation and management by telemedicine. The patient expressed understanding and agreed to proceed. Virtual Visit via Video Note  I connected with Diamond Ellis on 11/10/22 at  1:30 PM EST by a video enabled telemedicine application and verified that I am speaking with the correct person using two identifiers.  Location: Patient: home Provider: office   I discussed the limitations of evaluation and management by telemedicine and the availability of in person appointments. The patient expressed understanding and agreed to proceed.  History of Present Illness:    Observations/Objective:   Assessment and Plan:   Follow Up Instructions:    I discussed the assessment and treatment plan with the patient. The patient was provided an opportunity to ask questions and all were answered. The patient agreed with the plan and demonstrated an understanding of the instructions.   The patient was advised to call back or seek an in-person evaluation if the symptoms worsen or if the condition fails to improve as anticipated.  I provided 10 minutes of non-face-to-face time during this encounter.   Chalmers Guest, FNP  Subjective   Patient ID: Diamond Ellis, female    DOB: 1976-08-16  Age: 47 y.o. MRN: [id number]  Chief Complaint  Patient presents with   Nasal Congestion    Symptoms started Saturday. Tested Negative for Covid Sunday. Pt states that she took OTC Advil.    Facial Pain    Around eyes.     HPI Presents today for an acute visit with complaint of sinus pressure and nasal congestion after being in a large group meeting. Symptoms started with eye pressure and swelling under each eye. Nasal congestion with green drainage. She has been running a fever. Had  nosebleed while on airplane.  Symptoms have been present  4 days, since Saturday Associated symptoms include: fever: T-max: 101.0  Pertinent negatives: no chills, no chest pain, no shortness of breath, no cough Pain severity: n/a Treatments tried include : rest and vitamin C and Advil Treatment effective : not effective Sick contacts : large group meeting where everyone was sick. Co-workers tested negative for Covid.     Review of Systems  Constitutional:  Positive for fever. Negative for chills.  HENT:  Positive for congestion, nosebleeds (on airplane) and sinus pain.   Respiratory:  Negative for shortness of breath.   Cardiovascular:  Negative for chest pain.      Objective:     Temp (!) 101 F (38.3 C) (Oral) Comment: Per pt  Ht 5' 3"$  (1.6 m)   Wt 134 lb 0.6 oz (60.8 kg)   BMI 23.74 kg/m  BP Readings from Last 3 Encounters:  10/05/22 112/69  04/05/22 111/77  03/23/22 115/76      Physical Exam Vitals reviewed: full PE not performed, video visit..  Constitutional:      General: She is not in acute distress.    Appearance: Normal appearance.  Pulmonary:     Effort: Pulmonary effort is normal.  Skin:    General: Skin is warm and dry.  Neurological:     General: No focal deficit present.     Mental Status: She is alert. Mental status is at baseline.  Psychiatric:        Mood and Affect: Mood normal.  Behavior: Behavior normal.        Thought Content: Thought content normal.        Judgment: Judgment normal.     No results found for any visits on 11/10/22.    The 10-year ASCVD risk score (Arnett DK, et al., 2019) is: 2.4%    Assessment & Plan:   Problem List Items Addressed This Visit     Acute bacterial sinusitis - Primary    Sinus pressure, nasal congestion, fever.  Tmax 101.0.  Symptoms consistent with acute sinusitis.  Denies chest pain, chills, shortness of breath, cough.  She has tried rest and vitamin C and Advil which have not been  effective in relieving her symptoms.  Patient was exposed in a large group meeting where everyone was sick.  Symptoms have been present for 4 days.  Recommend sinus rinses, supportive therapy, doxycycline 100 mg twice daily for 10 days.  She will follow-up if symptoms do not improve.      Relevant Medications   doxycycline (VIBRA-TABS) 100 MG tablet  Agrees with plan of care discussed.  Questions answered. Sinus rinses, humidification, acetaminophen or ibuprofen for pain.   Return if symptoms worsen or fail to improve.    Chalmers Guest, FNP

## 2023-01-24 ENCOUNTER — Telehealth: Payer: Self-pay | Admitting: General Practice

## 2023-01-24 NOTE — Transitions of Care (Post Inpatient/ED Visit) (Addendum)
   01/24/2023  Name: Diamond Ellis MRN: [id number] DOB: 1976/06/24  Today's TOC FU Call Status: Today's TOC FU Call Status:: Successful TOC FU Call Competed TOC FU Call Complete Date: 01/24/23  Transition Care Management Follow-up Telephone Call Date of Discharge: 01/24/23 Discharge Facility: Other ([facility]) Name of Other ([facility]) Discharge Facility: [facility] Type of Discharge: Emergency Department Reason for ED Visit: Other: (abomindal pain) How have you been since you were released from the hospital?: Same Any questions or concerns?: No  Items Reviewed: Did you receive and understand the discharge instructions provided?: Yes Medications obtained and verified?: Yes (Medications Reviewed) Any new allergies since your discharge?: No Dietary orders reviewed?: NA Do you have support at home?: Yes  Home Care and Equipment/Supplies: Were Home Health Services Ordered?: NA Any new equipment or medical supplies ordered?: NA  Functional Questionnaire: Do you need assistance with bathing/showering or dressing?: No Do you need assistance with meal preparation?: No Do you need assistance with eating?: No Do you have difficulty maintaining continence: No Do you need assistance with getting out of bed/getting out of a chair/moving?: No Do you have difficulty managing or taking your medications?: No  Follow up appointments reviewed: PCP Follow-up appointment confirmed?: NA MD Provider Line [phone] Given: Yes Specialist Hospital Follow-up appointment confirmed?: Yes Date of Specialist follow-up appointment?: 01/26/23 Follow-Up Specialty Provider:: [facility] Do you need transportation to your follow-up appointment?: No Do you understand care options if your condition(s) worsen?: Yes-patient verbalized understanding    SIGNATURE Modesto Charon, RN BSN

## 2023-01-25 ENCOUNTER — Telehealth: Payer: Self-pay | Admitting: Physician Assistant

## 2023-01-25 NOTE — Telephone Encounter (Signed)
Diamond Ellis complains of cramping, bloating, feeling like her stomach is " rolling". Patient states the cramping feels like menstrual cramps sometimes but she has had a complete hysterectomy. Please advise.

## 2023-01-25 NOTE — Telephone Encounter (Signed)
Contacted [facility] to schedule an appointment but patient is currently under care at [facility]. Patient would need to transfer care from [facility] to [facility]. Patient states she wants to stay with [facility] and see if she can be seen sooner than June for colonoscopy.

## 2023-01-25 NOTE — Telephone Encounter (Signed)
OK, that makes sense.  Then what I would recommend is that she call them to get on a wait list or cancellation list.

## 2023-01-25 NOTE — Telephone Encounter (Signed)
Patient was seen at [facility] ED and referral placed for Colonoscopy to [facility].

## 2023-01-25 NOTE — Telephone Encounter (Signed)
Spoke with Victorino Dike at [facility], Patient had office visit today 01/25/23 and scheduled colonoscopy for 03/25/2023 at 2:30 pm. No sooner appointments at this time but patient has been added to wait list at both [facility] and [facility] offices.

## 2023-01-25 NOTE — Telephone Encounter (Signed)
Pt aware she has been added to both [facility] and [facility] wait list for colonoscopy. Patient is asking for Rx for stomach spasms to help calm things down. Patient would like Rx called into [facility] on [address]. Please advise.

## 2023-01-25 NOTE — Telephone Encounter (Signed)
Patient requesting an referral for a colonoscopy. Patient was recently seen by a [facility] doctor and was recommended to have one done in 2 weeks.

## 2023-01-26 NOTE — Telephone Encounter (Signed)
Patient advised.

## 2023-02-15 LAB — HM COLONOSCOPY

## 2023-04-11 ENCOUNTER — Encounter: Payer: Self-pay | Admitting: Physician Assistant

## 2023-05-12 ENCOUNTER — Telehealth: Payer: Managed Care, Other (non HMO) | Admitting: Family Medicine

## 2023-05-12 ENCOUNTER — Encounter: Payer: Self-pay | Admitting: Family Medicine

## 2023-05-12 VITALS — Temp 99.0°F | Ht 63.0 in | Wt 128.0 lb

## 2023-05-12 DIAGNOSIS — J018 Other acute sinusitis: Secondary | ICD-10-CM

## 2023-05-12 DIAGNOSIS — B9689 Other specified bacterial agents as the cause of diseases classified elsewhere: Secondary | ICD-10-CM

## 2023-05-12 MED ORDER — DOXYCYCLINE HYCLATE 100 MG PO TABS: 100.0000 mg | ORAL_TABLET | Freq: Two times a day (BID) | ORAL | 0 refills | Status: AC

## 2023-05-12 NOTE — Progress Notes (Signed)
   Acute Office Visit  Subjective:     Patient ID: Diamond Ellis, female    DOB: December 13, 1975, 46 y.o.   MRN: [id number]  Chief Complaint  Patient presents with   Sinus Problem    X 2 days nose drainage congestion   I connected with  Diamond Ellis on 05/12/23 by a video and audio enabled telemedicine application and verified that I am speaking with the correct person using two identifiers.  Patient Location: Home  Provider Location: Office/Clinic  I discussed the limitations of evaluation and management by telemedicine. The patient expressed understanding and agreed to proceed.   HPI Patient is in today for video visit. Has concerns about nasal congestion, sinus eye pressure, and body aches. This has been going on for a few days now. She thought she was getting better and then she started to run a low grade fever yesterday.   Review of Systems  Constitutional:  Positive for fever. Negative for chills.  HENT:  Positive for congestion and sinus pain.   Respiratory:  Negative for cough and shortness of breath.   Cardiovascular:  Negative for chest pain.  Musculoskeletal:  Positive for myalgias.  Neurological:  Negative for headaches.        Objective:    Temp 99 F (37.2 C) (Oral)   Ht 5\' 3"  (1.6 m)   Wt 128 lb (58.1 kg)   BMI 22.67 kg/m    Physical Exam Vitals and nursing note reviewed.  Constitutional:      General: She is not in acute distress.    Appearance: Normal appearance.  HENT:     Head: Normocephalic and atraumatic.     Comments: Has tenderness to palpation of maxillary and frontal sinuses bilaterally    Right Ear: External ear normal.     Left Ear: External ear normal.     Nose: Congestion present.  Eyes:     Conjunctiva/sclera: Conjunctivae normal.  Pulmonary:     Effort: Pulmonary effort is normal.  Neurological:     General: No focal deficit present.     Mental Status: She is alert and oriented to person, place, and time.  Psychiatric:        Mood  and Affect: Mood normal.        Behavior: Behavior normal.        Thought Content: Thought content normal.        Judgment: Judgment normal.     No results found for any visits on 05/12/23.      Assessment & Plan:   Problem List Items Addressed This Visit       Respiratory   Acute bacterial sinusitis - Primary    - pt presents with a few days of sinus pain/congestion, myalgias and low grade fever. Based on symptoms and progression of symptoms will go ahead and start abx bid for 7 days.  - follow up if no better      Relevant Medications   doxycycline (VIBRA-TABS) 100 MG tablet    Meds ordered this encounter  Medications   doxycycline (VIBRA-TABS) 100 MG tablet    Sig: Take 1 tablet (100 mg total) by mouth 2 (two) times daily for 7 days.    Dispense:  14 tablet    Refill:  0    No follow-ups on file.  Charlton Amor, DO

## 2023-05-12 NOTE — Assessment & Plan Note (Signed)
-   pt presents with a few days of sinus pain/congestion, myalgias and low grade fever. Based on symptoms and progression of symptoms will go ahead and start abx bid for 7 days.  - follow up if no better

## 2023-06-03 ENCOUNTER — Other Ambulatory Visit: Payer: Self-pay

## 2023-06-03 ENCOUNTER — Ambulatory Visit
Admission: EM | Admit: 2023-06-03 | Discharge: 2023-06-03 | Disposition: A | Discharge status: Home / Self Care | Payer: Managed Care, Other (non HMO) | Attending: Family Medicine | Admitting: Family Medicine

## 2023-06-03 ENCOUNTER — Encounter: Payer: Self-pay | Admitting: *Deleted

## 2023-06-03 DIAGNOSIS — R0981 Nasal congestion: Secondary | ICD-10-CM | POA: Diagnosis not present

## 2023-06-03 DIAGNOSIS — J309 Allergic rhinitis, unspecified: Secondary | ICD-10-CM | POA: Diagnosis not present

## 2023-06-03 MED ORDER — FEXOFENADINE HCL 180 MG PO TABS: 180.0000 mg | ORAL_TABLET | Freq: Every day | ORAL | 0 refills | Status: DC

## 2023-06-03 MED ORDER — PREDNISONE 10 MG (21) PO TBPK: ORAL_TABLET | Freq: Every day | ORAL | 0 refills | Status: DC

## 2023-06-03 NOTE — Discharge Instructions (Addendum)
Patient to take medication as directed with food to completion.  Advised patient to take Allegra with Prednisone for the next 5 of 10 days.  Advised may use Allegra as needed for concurrent postnasal drainage/drip.  Encouraged increase daily water intake to 64 ounces per day while taking these medications.  Advised if symptoms worsen and/or unresolved please follow-up PCP or here for further evaluation.
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
## 2023-06-03 NOTE — ED Triage Notes (Addendum)
Recently tx for sinus infection with doxy- finished last week. States initially felt better but now having clear nasal drainage. She has not tried any OTC treatments as of yet

## 2023-06-13 ENCOUNTER — Telehealth: Payer: Self-pay | Admitting: Physician Assistant

## 2023-06-13 DIAGNOSIS — Z131 Encounter for screening for diabetes mellitus: Secondary | ICD-10-CM

## 2023-06-13 DIAGNOSIS — E7800 Pure hypercholesterolemia, unspecified: Secondary | ICD-10-CM

## 2023-06-13 DIAGNOSIS — Z0000 Encounter for general adult medical examination without abnormal findings: Secondary | ICD-10-CM

## 2023-06-13 DIAGNOSIS — E559 Vitamin D deficiency, unspecified: Secondary | ICD-10-CM

## 2023-06-13 NOTE — Telephone Encounter (Signed)
Patient wants lab work orders in before her Physical appt. On 9/24

## 2023-06-14 NOTE — Telephone Encounter (Signed)
Patient informed and will stop by the office for lab work to be drawn.

## 2023-06-14 NOTE — Telephone Encounter (Signed)
Ok orders are in and can come any time fasting upstairs or downstairs.

## 2023-06-22 ENCOUNTER — Encounter: Payer: Self-pay | Admitting: Physician Assistant

## 2023-06-22 ENCOUNTER — Ambulatory Visit (INDEPENDENT_AMBULATORY_CARE_PROVIDER_SITE_OTHER): Payer: Managed Care, Other (non HMO) | Admitting: Physician Assistant

## 2023-06-22 VITALS — BP 121/84 | HR 83 | Ht 63.0 in | Wt 133.0 lb

## 2023-06-22 DIAGNOSIS — Z0000 Encounter for general adult medical examination without abnormal findings: Secondary | ICD-10-CM

## 2023-06-22 DIAGNOSIS — F900 Attention-deficit hyperactivity disorder, predominantly inattentive type: Secondary | ICD-10-CM | POA: Diagnosis not present

## 2023-06-22 DIAGNOSIS — E7800 Pure hypercholesterolemia, unspecified: Secondary | ICD-10-CM | POA: Diagnosis not present

## 2023-06-22 DIAGNOSIS — Z79890 Hormone replacement therapy (postmenopausal): Secondary | ICD-10-CM

## 2023-06-22 DIAGNOSIS — Z789 Other specified health status: Secondary | ICD-10-CM

## 2023-06-22 DIAGNOSIS — E8940 Asymptomatic postprocedural ovarian failure: Secondary | ICD-10-CM | POA: Diagnosis not present

## 2023-06-22 DIAGNOSIS — E079 Disorder of thyroid, unspecified: Secondary | ICD-10-CM

## 2023-06-22 DIAGNOSIS — Z131 Encounter for screening for diabetes mellitus: Secondary | ICD-10-CM

## 2023-06-22 DIAGNOSIS — B3731 Acute candidiasis of vulva and vagina: Secondary | ICD-10-CM

## 2023-06-22 DIAGNOSIS — Z1329 Encounter for screening for other suspected endocrine disorder: Secondary | ICD-10-CM

## 2023-06-22 DIAGNOSIS — N951 Menopausal and female climacteric states: Secondary | ICD-10-CM

## 2023-06-22 DIAGNOSIS — F5101 Primary insomnia: Secondary | ICD-10-CM

## 2023-06-22 DIAGNOSIS — Z720 Tobacco use: Secondary | ICD-10-CM

## 2023-06-22 DIAGNOSIS — Z780 Asymptomatic menopausal state: Secondary | ICD-10-CM

## 2023-06-22 MED ORDER — LISDEXAMFETAMINE DIMESYLATE 30 MG PO CAPS
30.0000 mg | ORAL_CAPSULE | Freq: Every day | ORAL | 0 refills | Status: AC
Start: 2023-07-22 — End: ?

## 2023-06-22 MED ORDER — VARENICLINE TARTRATE 1 MG PO TABS
1.0000 mg | ORAL_TABLET | Freq: Two times a day (BID) | ORAL | 2 refills | Status: DC
Start: 2023-07-22 — End: 2024-01-12

## 2023-06-22 MED ORDER — PROGESTERONE MICRONIZED 100 MG PO CAPS
100.0000 mg | ORAL_CAPSULE | Freq: Every day | ORAL | 3 refills | Status: DC
Start: 2023-06-22 — End: 2024-06-22

## 2023-06-22 MED ORDER — TRAZODONE HCL 50 MG PO TABS
25.0000 mg | ORAL_TABLET | Freq: Every evening | ORAL | 3 refills | Status: DC | PRN
Start: 2023-06-22 — End: 2024-01-12

## 2023-06-22 MED ORDER — VARENICLINE TARTRATE (STARTER) 0.5 MG X 11 & 1 MG X 42 PO TBPK
ORAL_TABLET | ORAL | 0 refills | Status: DC
Start: 2023-06-22 — End: 2024-01-12

## 2023-06-22 MED ORDER — FLUCONAZOLE 150 MG PO TABS
150.0000 mg | ORAL_TABLET | Freq: Once | ORAL | 0 refills | Status: AC
Start: 2023-06-22 — End: 2023-06-22

## 2023-06-22 MED ORDER — LISDEXAMFETAMINE DIMESYLATE 30 MG PO CAPS
30.0000 mg | ORAL_CAPSULE | Freq: Every day | ORAL | 0 refills | Status: AC
Start: 2023-06-22 — End: ?

## 2023-06-22 MED ORDER — PITAVASTATIN CALCIUM 4 MG PO TABS: 1.0000 | ORAL_TABLET | Freq: Every day | ORAL | 3 refills | Status: DC

## 2023-06-22 MED ORDER — LISDEXAMFETAMINE DIMESYLATE 30 MG PO CAPS
30.0000 mg | ORAL_CAPSULE | Freq: Every day | ORAL | 0 refills | Status: AC
Start: 2023-08-21 — End: ?

## 2023-06-22 MED ORDER — ESTRADIOL 1 MG PO TABS
1.0000 mg | ORAL_TABLET | Freq: Every day | ORAL | 3 refills | Status: DC
Start: 2023-06-22 — End: 2024-06-22

## 2023-06-22 NOTE — Progress Notes (Unsigned)
Complete physical exam  Patient: Diamond Ellis   DOB: 05-20-1976   47 y.o. Female  MRN: [id number]  Subjective:    No chief complaint on file.   Diamond Ellis is a 47 y.o. female who presents today for a complete physical exam. She reports consuming a general diet.  Cycling and walking and stairs.   She generally feels well. She reports sleeping fairly well. She does have additional problems to discuss today.   She would like to stop smoking again. She stopped for 4 months and then started back up again. She is smoking less. She is on HRT and does not want to come off of it.   Recently finished abx and having yeast vaginitis symptoms. She would like a diflucan to treat.   She needs refills on her vyvanse. No concerns. She is having trouble sleeping but not related to stimulant per patient. Would like to try something.    Most recent fall risk assessment:    05/12/2023    1:02 PM  Fall Risk   Falls in the past year? 0  Number falls in past yr: 0  Injury with Fall? 0  Risk for fall due to : No Fall Risks  Follow up Falls evaluation completed     Most recent depression screenings:    05/12/2023    1:02 PM 10/05/2022    8:36 AM  PHQ 2/9 Scores  PHQ - 2 Score 0 0  PHQ- 9 Score 7       Patient Care Team: Nolene Ebbs as PCP - General (Family Medicine)   Outpatient Medications Prior to Visit  Medication Sig   Ascorbic Acid (VITAMIN C PO) Take by mouth daily.   ciclopirox (PENLAC) 8 % solution Apply topically at bedtime. Apply over nail and surrounding skin. Apply daily over previous coat. After seven (7) days, may remove with alcohol and continue cycle.   EPINEPHrine 0.3 mg/0.3 mL IJ SOAJ injection Inject 0.3 mg into the muscle as needed.   Multiple Vitamin tablet Take 1 tablet by mouth daily.   tretinoin (RETIN-A) 0.1 % cream Apply topically at bedtime.   [DISCONTINUED] estradiol (ESTRACE) 1 MG tablet TAKE 1 TABLET BY MOUTH EVERY DAY   [DISCONTINUED]  lisdexamfetamine (VYVANSE) 30 MG capsule Take 1 capsule (30 mg total) by mouth daily.   [DISCONTINUED] lisdexamfetamine (VYVANSE) 30 MG capsule Take 1 capsule (30 mg total) by mouth daily.   [DISCONTINUED] lisdexamfetamine (VYVANSE) 30 MG capsule Take 1 capsule (30 mg total) by mouth daily.   [DISCONTINUED] Pitavastatin Calcium 4 MG TABS Take 1 tablet (4 mg total) by mouth daily.   [DISCONTINUED] predniSONE (STERAPRED UNI-PAK 21 TAB) 10 MG (21) TBPK tablet Take by mouth daily. Take 6 tabs by mouth daily  for 2 days, then 5 tabs for 2 days, then 4 tabs for 2 days, then 3 tabs for 2 days, 2 tabs for 2 days, then 1 tab by mouth daily for 2 days   [DISCONTINUED] progesterone (PROMETRIUM) 100 MG capsule Take 1 capsule (100 mg total) by mouth daily.   fexofenadine (ALLEGRA ALLERGY) 180 MG tablet Take 1 tablet (180 mg total) by mouth daily for 15 days.   No facility-administered medications prior to visit.    ROS  See HPI.       Objective:     BP 121/84   Pulse 83   Ht 5\' 3"  (1.6 m)   Wt 133 lb (60.3 kg)   SpO2 96%  BMI 23.56 kg/m  BP Readings from Last 3 Encounters:  06/22/23 121/84  06/03/23 118/84  10/05/22 112/69   Wt Readings from Last 3 Encounters:  06/22/23 133 lb (60.3 kg)  05/12/23 128 lb (58.1 kg)  11/10/22 134 lb 0.6 oz (60.8 kg)      Physical Exam   BP 121/84   Pulse 83   Ht 5\' 3"  (1.6 m)   Wt 133 lb (60.3 kg)   SpO2 96%   BMI 23.56 kg/m   General Appearance:    Alert, cooperative, no distress, appears stated age  Head:    Normocephalic, without obvious abnormality, atraumatic  Eyes:    PERRL, conjunctiva/corneas clear, EOM's intact, fundi    benign, both eyes  Ears:    Normal TM's and external ear canals, both ears  Nose:   Nares normal, septum midline, mucosa normal, no drainage    or sinus tenderness  Throat:   Lips, mucosa, and tongue normal; teeth and gums normal  Neck:   Supple, symmetrical, trachea midline, no adenopathy;    thyroid:  no  enlargement/tenderness/nodules; no carotid   bruit or JVD  Back:     Symmetric, no curvature, ROM normal, no CVA tenderness  Lungs:     Clear to auscultation bilaterally, respirations unlabored  Chest Wall:    No tenderness or deformity   Heart:    Regular rate and rhythm, S1 and S2 normal, no murmur, rub   or gallop     Abdomen:     Soft, non-tender, bowel sounds active all four quadrants,    no masses, no organomegaly        Extremities:   Extremities normal, atraumatic, no cyanosis or edema  Pulses:   2+ and symmetric all extremities  Skin:   Skin color, texture, turgor normal, no rashes or lesions  Lymph nodes:   Cervical, supraclavicular, and axillary nodes normal  Neurologic:   CNII-XII intact, normal strength, sensation and reflexes    throughout      Assessment & Plan:    Routine Health Maintenance and Physical Exam  Immunization History  Administered Date(s) Administered   Hep B, Unspecified 09/27/2013   Hepatitis A, Adult 09/27/2013   Hepatitis B 09/27/2013   Influenza-Unspecified 10/29/2007, 09/27/2013   MMR 09/27/2008   Meningococcal Acwy, Unspecified 01/26/2008   Meningococcal Conjugate 01/26/2008   PFIZER(Purple Top)SARS-COV-2 Vaccination 12/20/2019, 01/10/2020   Pneumococcal-Unspecified 11/26/2007   Tdap 01/26/2008, 03/20/2016   Varicella 11/26/2007    Health Maintenance  Topic Date Due   INFLUENZA VACCINE  12/26/2023 (Originally 04/28/2023)   MAMMOGRAM  06/06/2024   DTaP/Tdap/Td (3 - Td or Tdap) 03/20/2026   Colonoscopy  02/14/2030   Hepatitis C Screening  Completed   HIV Screening  Completed   HPV VACCINES  Aged Out   COVID-19 Vaccine  Discontinued    Discussed health benefits of physical activity, and encouraged her to engage in regular exercise appropriate for her age and condition.  Marland Kitchen.Diagnoses and all orders for this visit:  Routine physical examination -     VITAMIN D 25 Hydroxy (Vit-D Deficiency, Fractures) -     CMP14+EGFR -     Lipid  panel -     TSH  Surgical menopause -     progesterone (PROMETRIUM) 100 MG capsule; Take 1 capsule (100 mg total) by mouth daily. -     estradiol (ESTRACE) 1 MG tablet; Take 1 tablet (1 mg total) by mouth daily. -     VITAMIN D 25  Hydroxy (Vit-D Deficiency, Fractures)  Post-menopausal -     progesterone (PROMETRIUM) 100 MG capsule; Take 1 capsule (100 mg total) by mouth daily. -     estradiol (ESTRACE) 1 MG tablet; Take 1 tablet (1 mg total) by mouth daily. -     VITAMIN D 25 Hydroxy (Vit-D Deficiency, Fractures)  Menopausal symptom -     progesterone (PROMETRIUM) 100 MG capsule; Take 1 capsule (100 mg total) by mouth daily. -     estradiol (ESTRACE) 1 MG tablet; Take 1 tablet (1 mg total) by mouth daily.  Surgical menopause on hormone replacement therapy -     estradiol (ESTRACE) 1 MG tablet; Take 1 tablet (1 mg total) by mouth daily.  Attention deficit hyperactivity disorder (ADHD), predominantly inattentive type -     lisdexamfetamine (VYVANSE) 30 MG capsule; Take 1 capsule (30 mg total) by mouth daily. -     lisdexamfetamine (VYVANSE) 30 MG capsule; Take 1 capsule (30 mg total) by mouth daily. -     lisdexamfetamine (VYVANSE) 30 MG capsule; Take 1 capsule (30 mg total) by mouth daily.  Elevated LDL cholesterol level -     Pitavastatin Calcium 4 MG TABS; Take 1 tablet (4 mg total) by mouth daily. -     Lipid panel  Statin intolerance -     Pitavastatin Calcium 4 MG TABS; Take 1 tablet (4 mg total) by mouth daily.  Thyroid disorder  Thyroid disorder screening -     TSH  Screening for diabetes mellitus -     CMP14+EGFR  Tobacco abuse -     Varenicline Tartrate, Starter, (CHANTIX STARTING MONTH PAK) 0.5 MG X 11 & 1 MG X 42 TBPK; Take one 0.5 mg tablet by mouth once daily for 3 days, then increase to one 0.5 mg tablet twice daily for 4 days, then increase to one 1 mg tablet twice daily. -     varenicline (CHANTIX) 1 MG tablet; Take 1 tablet (1 mg total) by mouth 2 (two) times  daily.  Yeast vaginitis -     fluconazole (DIFLUCAN) 150 MG tablet; Take 1 tablet (150 mg total) by mouth once for 1 dose.  Primary insomnia -     traZODone (DESYREL) 50 MG tablet; Take 0.5-1 tablets (25-50 mg total) by mouth at bedtime as needed for sleep.   .. Discussed 150 minutes of exercise a week.  Encouraged vitamin D 1000 units and Calcium 1300mg  or 4 servings of dairy a day.  Fasting labs ordered Vyvanse refilled.  Dilfucan given.  Trazodone trial before bedtime. Pap and mammogram UTD.  Colonoscopy UTD Declined flu shot   Discussed HRT and smoking. Pt aware of the increased risk of blood clots/stroke/CV events associated with this and she must stop smoking or we would have to stop given HRT Pt agreed to quit Chantix started, discussed side effects Follow up in 3 months   Return in about 3 months (around 09/21/2023) for smoking/insomnia.     Tandy Gaw, PA-C

## 2023-06-22 NOTE — Patient Instructions (Signed)

## 2023-06-23 ENCOUNTER — Encounter: Payer: Self-pay | Admitting: Physician Assistant

## 2023-06-24 ENCOUNTER — Telehealth: Payer: Self-pay | Admitting: Physician Assistant

## 2023-06-24 LAB — CMP14+EGFR
ALT: 10 [IU]/L (ref 0–32)
AST: 17 [IU]/L (ref 0–40)
Albumin: 4.1 g/dL (ref 3.9–4.9)
Alkaline Phosphatase: 82 [IU]/L (ref 44–121)
BUN/Creatinine Ratio: 12 (ref 9–23)
BUN: 9 mg/dL (ref 6–24)
Bilirubin Total: 0.4 mg/dL (ref 0.0–1.2)
CO2: 23 mmol/L (ref 20–29)
Calcium: 8.8 mg/dL (ref 8.7–10.2)
Chloride: 107 mmol/L — ABNORMAL HIGH (ref 96–106)
Creatinine, Ser: 0.75 mg/dL (ref 0.57–1.00)
Globulin, Total: 1.8 g/dL (ref 1.5–4.5)
Glucose: 79 mg/dL (ref 70–99)
Potassium: 4.3 mmol/L (ref 3.5–5.2)
Sodium: 143 mmol/L (ref 134–144)
Total Protein: 5.9 g/dL — ABNORMAL LOW (ref 6.0–8.5)
eGFR: 98 mL/min/{1.73_m2} (ref >59)

## 2023-06-24 LAB — VITAMIN D 25 HYDROXY (VIT D DEFICIENCY, FRACTURES): Vit D, 25-Hydroxy: 36.9 ng/mL (ref 30.0–100.0)

## 2023-06-24 LAB — LIPID PANEL
Chol/HDL Ratio: 4.2 {ratio} (ref 0.0–4.4)
Cholesterol, Total: 260 mg/dL — ABNORMAL HIGH (ref 100–199)
HDL: 62 mg/dL (ref >39)
LDL Chol Calc (NIH): 181 mg/dL — ABNORMAL HIGH (ref 0–99)
Triglycerides: 100 mg/dL (ref 0–149)
VLDL Cholesterol Cal: 17 mg/dL (ref 5–40)

## 2023-06-24 LAB — TSH: TSH: 2.08 u[IU]/mL (ref 0.450–4.500)

## 2023-06-24 NOTE — Telephone Encounter (Signed)
Patient says that she can not transfer her Vyvanse from [facility] to another pharmacy. [facility] does not take her insurance.

## 2023-06-24 NOTE — Progress Notes (Signed)
Diamond Ellis,   Vitamin D is better. Make sure taking at least 2000units a day.  Thyroid looks good.  LDL not to goal. Are you taking the livalo daily.   Marland Kitchen.The 10-year ASCVD risk score (Arnett DK, et al., 2019) is: 3.6%   Values used to calculate the score:     Age: 47 years     Sex: Female     Is Non-Hispanic African American: No     Diabetic: No     Tobacco smoker: Yes     Systolic Blood Pressure: 121 mmHg     Is BP treated: No     HDL Cholesterol: 62 mg/dL     Total Cholesterol: 260 mg/dL  Overall 10 year risk is still low. I would strongly suggest starting a statin to lower CV risk.

## 2023-06-28 MED ORDER — REPATHA SURECLICK 140 MG/ML ~~LOC~~ SOAJ: 140.0000 mg | SUBCUTANEOUS | 11 refills | Status: DC

## 2023-06-28 NOTE — Progress Notes (Signed)
Ok repatha sent to take every 2 weeks as injection.

## 2023-06-28 NOTE — Addendum Note (Signed)
Addended by: Jomarie Longs on: 06/28/2023 01:03 PM   Modules accepted: Orders

## 2023-06-29 ENCOUNTER — Other Ambulatory Visit: Payer: Self-pay

## 2023-06-29 DIAGNOSIS — F900 Attention-deficit hyperactivity disorder, predominantly inattentive type: Secondary | ICD-10-CM

## 2023-06-29 MED ORDER — LISDEXAMFETAMINE DIMESYLATE 30 MG PO CAPS
30.0000 mg | ORAL_CAPSULE | Freq: Every day | ORAL | 0 refills | Status: DC
Start: 2023-07-22 — End: 2024-01-12

## 2023-06-29 MED ORDER — LISDEXAMFETAMINE DIMESYLATE 30 MG PO CAPS
30.0000 mg | ORAL_CAPSULE | Freq: Every day | ORAL | 0 refills | Status: DC
Start: 2023-08-21 — End: 2024-01-12

## 2023-06-29 MED ORDER — REPATHA SURECLICK 140 MG/ML ~~LOC~~ SOAJ: 140.0000 mg | SUBCUTANEOUS | 11 refills | Status: DC

## 2023-06-29 MED ORDER — LISDEXAMFETAMINE DIMESYLATE 30 MG PO CAPS
30.0000 mg | ORAL_CAPSULE | Freq: Every day | ORAL | 0 refills | Status: DC
Start: 2023-06-29 — End: 2024-01-12

## 2023-06-29 NOTE — Telephone Encounter (Signed)
Patient requesting to move vyvanse prescription form [facility] to [facility] [address]. [name] states that [employer] does not cover under her insurance and is very expensive there.

## 2023-09-16 ENCOUNTER — Other Ambulatory Visit: Payer: Self-pay | Admitting: Physician Assistant

## 2023-09-16 DIAGNOSIS — B351 Tinea unguium: Secondary | ICD-10-CM

## 2023-10-28 ENCOUNTER — Other Ambulatory Visit: Payer: Self-pay | Admitting: Physician Assistant

## 2023-10-28 DIAGNOSIS — Z1231 Encounter for screening mammogram for malignant neoplasm of breast: Secondary | ICD-10-CM

## 2023-12-05 ENCOUNTER — Ambulatory Visit: Payer: Self-pay | Admitting: Physician Assistant

## 2023-12-05 ENCOUNTER — Encounter: Payer: Self-pay | Admitting: Family Medicine

## 2023-12-05 ENCOUNTER — Ambulatory Visit (INDEPENDENT_AMBULATORY_CARE_PROVIDER_SITE_OTHER): Admitting: Family Medicine

## 2023-12-05 VITALS — BP 108/74 | HR 96 | Temp 97.3°F | Ht 63.0 in | Wt 141.8 lb

## 2023-12-05 DIAGNOSIS — N61 Mastitis without abscess: Secondary | ICD-10-CM | POA: Diagnosis not present

## 2023-12-05 MED ORDER — FLUCONAZOLE 150 MG PO TABS: 150.0000 mg | ORAL_TABLET | ORAL | 0 refills | Status: DC | PRN

## 2023-12-05 MED ORDER — SULFAMETHOXAZOLE-TRIMETHOPRIM 800-160 MG PO TABS: 1.0000 | ORAL_TABLET | Freq: Two times a day (BID) | ORAL | 0 refills | Status: DC

## 2023-12-05 NOTE — Telephone Encounter (Signed)
  Chief Complaint: right breast painful rash Symptoms: red painful rash to right breast about the size of a golfball, pain in right nipple Frequency: since Saturday 12/03/23 Pertinent Negatives: Patient denies recent breast injury, fever, nipple discharge. Disposition: [] ED /[] Urgent Care (no appt availability in office) / [x] Appointment(In office/virtual)/ []  [facility]/ [] Home Care/ [] Refused Recommended Disposition /[] [facility]/ []  Follow-up with PCP Additional Notes: Patient states she was seen at the dermatologist this morning and he told her it was likely dermatitis. She states she would like a second opinion. Last mammogram was about 2 years ago, she states she has her next one scheduled for the 27th of this month. Offered patient acute visit tomorrow at her PCP office, patient states she would prefer to be seen today. Patient scheduled with [facility] this afternoon.  Patient verbalizes understanding to call back for new or worsening problems.  Copied from CRM [id number]. Topic: Clinical - Red Word Triage >> Dec 05, 2023 10:37 AM Nila Nephew wrote: Red Word that prompted transfer to Nurse Triage: Woke up wih burning situation in right breast, red and hot to the touch, almost like a rash on Saturday. Today, the area is red still and feels almost like it's scabbed over. Reason for Disposition  [1] Breast looks infected (spreading redness, feels hot or painful to touch) AND [2] no fever  Answer Assessment - Initial Assessment Questions 1. SYMPTOM: "What's the main symptom you're concerned about?"  (e.g., lump, pain, rash, nipple discharge)     Red, warm rash with scab (bigger than a golf ball). Pain in right nipple/burning stabbing sensation deep in right breast. 6/10 discomfort.  2. LOCATION: "Where is the rash located?"     Right breast near nipple.  3. ONSET: "When did rash/pain  start?"     Saturday morning 12/03/23.  4. PRIOR HISTORY: "Do you have any history of prior  problems with your breasts?" (e.g., lumps, cancer, fibrocystic breast disease)     Denies.  5. CAUSE: "What do you think is causing this symptom?"     Unsure.  6. OTHER SYMPTOMS: "Do you have any other symptoms?" (e.g., fever, breast pain, redness or rash, nipple discharge)     Body aches.  7. PREGNANCY-BREASTFEEDING: "Is there any chance you are pregnant?" "When was your last menstrual period?" "Are you breastfeeding?"     LMP 7 years ago, states she had a full hysterectomy.  Protocols used: Breast Symptoms-A-AH

## 2023-12-05 NOTE — Patient Instructions (Signed)
 It was very nice to see you today!  I think you probably have an infection.  Please start the Bactrim.  Use the Diflucan if you develop a yeast infection.  Let us know if not improving in the next few days.  Return if symptoms worsen or fail to improve.   Take care, Dr Jimmey Ralph
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
## 2023-12-21 ENCOUNTER — Ambulatory Visit: Payer: Managed Care, Other (non HMO)

## 2023-12-21 DIAGNOSIS — Z1231 Encounter for screening mammogram for malignant neoplasm of breast: Secondary | ICD-10-CM

## 2023-12-23 ENCOUNTER — Encounter: Payer: Self-pay | Admitting: Physician Assistant

## 2023-12-23 NOTE — Progress Notes (Signed)
 Normal mammogram. Follow up in 1 year.

## 2023-12-27 ENCOUNTER — Ambulatory Visit: Admitting: Physician Assistant

## 2024-01-12 ENCOUNTER — Other Ambulatory Visit: Payer: Self-pay

## 2024-01-12 ENCOUNTER — Ambulatory Visit
Admission: RE | Admit: 2024-01-12 | Discharge: 2024-01-12 | Disposition: A | Discharge status: Home / Self Care | Source: Ambulatory Visit | Attending: Family Medicine | Admitting: Family Medicine

## 2024-01-12 VITALS — BP 109/74 | HR 100 | Temp 98.0°F | Resp 16

## 2024-01-12 DIAGNOSIS — J111 Influenza due to unidentified influenza virus with other respiratory manifestations: Secondary | ICD-10-CM

## 2024-01-12 HISTORY — DX: Endometriosis, unspecified: N80.9

## 2024-01-12 MED ORDER — HYDROCOD POLI-CHLORPHE POLI ER 10-8 MG/5ML PO SUER: 5.0000 mL | Freq: Two times a day (BID) | ORAL | 0 refills | Status: DC | PRN

## 2024-01-12 MED ORDER — OSELTAMIVIR PHOSPHATE 75 MG PO CAPS: 75.0000 mg | ORAL_CAPSULE | Freq: Two times a day (BID) | ORAL | 0 refills | Status: DC
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
## 2024-01-12 NOTE — Discharge Instructions (Addendum)
 Take the Tamiflu 2 times a day Drink lots of fluids May take Tylenol or ibuprofen for pain and fever I have scribed Tussionex if needed for severe cough Call for problems

## 2024-01-12 NOTE — ED Triage Notes (Signed)
 Starting last night has had sore throat, fatigued, dry cough. Husband tested positive for flu and covid. Has had advil and vitamin c

## 2024-03-07 ENCOUNTER — Encounter: Payer: Self-pay | Admitting: Physician Assistant

## 2024-03-07 ENCOUNTER — Ambulatory Visit (INDEPENDENT_AMBULATORY_CARE_PROVIDER_SITE_OTHER): Admitting: Physician Assistant

## 2024-03-07 VITALS — BP 135/87 | HR 85 | Ht 63.0 in | Wt 141.0 lb

## 2024-03-07 DIAGNOSIS — Z23 Encounter for immunization: Secondary | ICD-10-CM | POA: Diagnosis not present

## 2024-03-07 DIAGNOSIS — I700 Atherosclerosis of aorta: Secondary | ICD-10-CM | POA: Diagnosis not present

## 2024-03-07 DIAGNOSIS — Z0000 Encounter for general adult medical examination without abnormal findings: Secondary | ICD-10-CM

## 2024-03-07 DIAGNOSIS — E8940 Asymptomatic postprocedural ovarian failure: Secondary | ICD-10-CM | POA: Diagnosis not present

## 2024-03-07 DIAGNOSIS — R5383 Other fatigue: Secondary | ICD-10-CM | POA: Insufficient documentation

## 2024-03-07 DIAGNOSIS — Z7184 Encounter for health counseling related to travel: Secondary | ICD-10-CM | POA: Diagnosis not present

## 2024-03-07 DIAGNOSIS — E559 Vitamin D deficiency, unspecified: Secondary | ICD-10-CM

## 2024-03-07 DIAGNOSIS — F419 Anxiety disorder, unspecified: Secondary | ICD-10-CM | POA: Diagnosis not present

## 2024-03-07 DIAGNOSIS — E7800 Pure hypercholesterolemia, unspecified: Secondary | ICD-10-CM

## 2024-03-07 DIAGNOSIS — Z716 Tobacco abuse counseling: Secondary | ICD-10-CM

## 2024-03-07 DIAGNOSIS — Z131 Encounter for screening for diabetes mellitus: Secondary | ICD-10-CM

## 2024-03-07 MED ORDER — ALPRAZOLAM 0.5 MG PO TABS
0.5000 mg | ORAL_TABLET | Freq: Two times a day (BID) | ORAL | 1 refills | Status: DC | PRN
Start: 2024-03-07 — End: 2024-06-22

## 2024-03-07 MED ORDER — CIPROFLOXACIN HCL 500 MG PO TABS
500.0000 mg | ORAL_TABLET | Freq: Two times a day (BID) | ORAL | 0 refills | Status: AC
Start: 2024-03-07 — End: 2024-03-17

## 2024-03-07 MED ORDER — ONDANSETRON 8 MG PO TBDP
8.0000 mg | ORAL_TABLET | Freq: Three times a day (TID) | ORAL | 1 refills | Status: AC | PRN
Start: 2024-03-07 — End: ?

## 2024-03-07 MED ORDER — ALPRAZOLAM 0.5 MG PO TABS: 0.5000 mg | ORAL_TABLET | Freq: Two times a day (BID) | ORAL | 1 refills | Status: DC | PRN

## 2024-03-07 MED ORDER — TYPHOID VACCINE PO CPDR
1.0000 | DELAYED_RELEASE_CAPSULE | ORAL | 0 refills | Status: DC
Start: 2024-03-07 — End: 2024-06-22

## 2024-03-07 MED ORDER — VARENICLINE TARTRATE (STARTER) 0.5 MG X 11 & 1 MG X 42 PO TBPK
ORAL_TABLET | ORAL | 0 refills | Status: DC
Start: 2024-03-07 — End: 2024-06-22

## 2024-03-07 MED ORDER — VARENICLINE TARTRATE 1 MG PO TABS: 1.0000 mg | ORAL_TABLET | Freq: Two times a day (BID) | ORAL | 2 refills | Status: DC
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
## 2024-03-07 NOTE — Patient Instructions (Addendum)
 Tdap, hep B, hep A given today.  Start typhoid capsules 1 week before travel.  Zofran for as needed nausea Cipro for travelers diarrhea as needed

## 2024-03-08 LAB — LIPID PANEL
Chol/HDL Ratio: 2.8 ratio (ref 0.0–4.4)
Cholesterol, Total: 155 mg/dL (ref 100–199)
HDL: 56 mg/dL (ref >39)
LDL Chol Calc (NIH): 85 mg/dL (ref 0–99)
Triglycerides: 71 mg/dL (ref 0–149)
VLDL Cholesterol Cal: 14 mg/dL (ref 5–40)

## 2024-03-08 LAB — CBC WITH DIFFERENTIAL/PLATELET
Basophils Absolute: 0.1 10*3/uL (ref 0.0–0.2)
Basos: 1 %
EOS (ABSOLUTE): 0.1 10*3/uL (ref 0.0–0.4)
Eos: 2 %
Hematocrit: 40 % (ref 34.0–46.6)
Hemoglobin: 13.1 g/dL (ref 11.1–15.9)
Immature Grans (Abs): 0 10*3/uL (ref 0.0–0.1)
Immature Granulocytes: 0 %
Lymphocytes Absolute: 2 10*3/uL (ref 0.7–3.1)
Lymphs: 34 %
MCH: 31.2 pg (ref 26.6–33.0)
MCHC: 32.8 g/dL (ref 31.5–35.7)
MCV: 95 fL (ref 79–97)
Monocytes Absolute: 0.4 10*3/uL (ref 0.1–0.9)
Monocytes: 7 %
Neutrophils Absolute: 3.3 10*3/uL (ref 1.4–7.0)
Neutrophils: 56 %
Platelets: 254 10*3/uL (ref 150–450)
RBC: 4.2 x10E6/uL (ref 3.77–5.28)
RDW: 12.4 % (ref 11.7–15.4)
WBC: 6 10*3/uL (ref 3.4–10.8)

## 2024-03-08 LAB — CMP14+EGFR
ALT: 11 IU/L (ref 0–32)
AST: 19 IU/L (ref 0–40)
Albumin: 4.2 g/dL (ref 3.9–4.9)
Alkaline Phosphatase: 92 IU/L (ref 44–121)
BUN/Creatinine Ratio: 14 (ref 9–23)
BUN: 11 mg/dL (ref 6–24)
Bilirubin Total: 0.3 mg/dL (ref 0.0–1.2)
CO2: 19 mmol/L — ABNORMAL LOW (ref 20–29)
Calcium: 8.9 mg/dL (ref 8.7–10.2)
Chloride: 106 mmol/L (ref 96–106)
Creatinine, Ser: 0.76 mg/dL (ref 0.57–1.00)
Globulin, Total: 1.6 g/dL (ref 1.5–4.5)
Glucose: 89 mg/dL (ref 70–99)
Potassium: 4.4 mmol/L (ref 3.5–5.2)
Sodium: 141 mmol/L (ref 134–144)
Total Protein: 5.8 g/dL — ABNORMAL LOW (ref 6.0–8.5)
eGFR: 97 mL/min/{1.73_m2} (ref >59)

## 2024-03-08 LAB — B12 AND FOLATE PANEL
Folate: 7.4 ng/mL (ref >3.0)
Vitamin B-12: 520 pg/mL (ref 232–1245)

## 2024-03-08 LAB — ESTRADIOL: Estradiol: 14.6 pg/mL

## 2024-03-08 LAB — IRON,TIBC AND FERRITIN PANEL
Ferritin: 75 ng/mL (ref 15–150)
Iron Saturation: 19 % (ref 15–55)
Iron: 51 ug/dL (ref 27–159)
Total Iron Binding Capacity: 263 ug/dL (ref 250–450)
UIBC: 212 ug/dL (ref 131–425)

## 2024-03-08 LAB — VITAMIN D 25 HYDROXY (VIT D DEFICIENCY, FRACTURES): Vit D, 25-Hydroxy: 28.5 ng/mL — ABNORMAL LOW (ref 30.0–100.0)

## 2024-03-08 LAB — TSH+FREE T4
Free T4: 1.33 ng/dL (ref 0.82–1.77)
TSH: 1.34 u[IU]/mL (ref 0.450–4.500)

## 2024-03-09 ENCOUNTER — Ambulatory Visit: Payer: Self-pay | Admitting: Physician Assistant

## 2024-03-09 NOTE — Progress Notes (Signed)
 Diamond Ellis,   Estradiol  low side normal.  Normal hemoglobin and iron.  Thyroid looks good.  B12 and folate look good.  Cholesterol looks GREAT!.  Vitamin D  low. How much are you taking now?  Protein low. Increase protein in diet to at least 80g a day.  Kidney and liver look good.

## 2024-06-01 ENCOUNTER — Other Ambulatory Visit: Payer: Self-pay | Admitting: Physician Assistant

## 2024-06-01 DIAGNOSIS — Z716 Tobacco abuse counseling: Secondary | ICD-10-CM

## 2024-06-22 ENCOUNTER — Encounter: Payer: Self-pay | Admitting: Physician Assistant

## 2024-06-22 ENCOUNTER — Ambulatory Visit: Admitting: Physician Assistant

## 2024-06-22 VITALS — BP 109/65 | HR 85 | Ht 63.0 in | Wt 141.0 lb

## 2024-06-22 DIAGNOSIS — F900 Attention-deficit hyperactivity disorder, predominantly inattentive type: Secondary | ICD-10-CM | POA: Diagnosis not present

## 2024-06-22 DIAGNOSIS — Z0000 Encounter for general adult medical examination without abnormal findings: Secondary | ICD-10-CM

## 2024-06-22 DIAGNOSIS — E7800 Pure hypercholesterolemia, unspecified: Secondary | ICD-10-CM

## 2024-06-22 DIAGNOSIS — N951 Menopausal and female climacteric states: Secondary | ICD-10-CM

## 2024-06-22 DIAGNOSIS — E8940 Asymptomatic postprocedural ovarian failure: Secondary | ICD-10-CM

## 2024-06-22 DIAGNOSIS — Z780 Asymptomatic menopausal state: Secondary | ICD-10-CM

## 2024-06-22 DIAGNOSIS — Z131 Encounter for screening for diabetes mellitus: Secondary | ICD-10-CM

## 2024-06-22 DIAGNOSIS — Z87891 Personal history of nicotine dependence: Secondary | ICD-10-CM

## 2024-06-22 DIAGNOSIS — Z789 Other specified health status: Secondary | ICD-10-CM

## 2024-06-22 DIAGNOSIS — L720 Epidermal cyst: Secondary | ICD-10-CM

## 2024-06-22 DIAGNOSIS — F419 Anxiety disorder, unspecified: Secondary | ICD-10-CM

## 2024-06-22 LAB — POCT GLYCOSYLATED HEMOGLOBIN (HGB A1C): Hemoglobin A1C: 5.2 % (ref 4.0–5.6)

## 2024-06-22 MED ORDER — ESTRADIOL 0.1 MG/GM VA CREA: 1.0000 | TOPICAL_CREAM | VAGINAL | 12 refills | Status: AC

## 2024-06-22 MED ORDER — LISDEXAMFETAMINE DIMESYLATE 30 MG PO CAPS: 30.0000 mg | ORAL_CAPSULE | Freq: Every day | ORAL | 0 refills | Status: AC

## 2024-06-22 MED ORDER — ESTRADIOL 0.1 MG/24HR TD PTWK: 0.1000 mg | MEDICATED_PATCH | TRANSDERMAL | 4 refills | Status: AC

## 2024-06-22 MED ORDER — PITAVASTATIN CALCIUM 4 MG PO TABS: 1.0000 | ORAL_TABLET | Freq: Every day | ORAL | 3 refills | Status: AC

## 2024-06-22 MED ORDER — ALPRAZOLAM 0.5 MG PO TABS: 0.5000 mg | ORAL_TABLET | Freq: Two times a day (BID) | ORAL | 1 refills | Status: AC | PRN

## 2024-06-22 MED ORDER — PROGESTERONE MICRONIZED 100 MG PO CAPS: 100.0000 mg | ORAL_CAPSULE | Freq: Every day | ORAL | 3 refills | Status: AC

## 2024-06-22 NOTE — Progress Notes (Signed)
 Complete physical exam  Patient: Diamond Ellis   DOB: April 11, 1976   48 y.o. Female  MRN: [id number]  Subjective:    Chief Complaint  Patient presents with   Annual Exam    Diamond Ellis is a 48 y.o. female who presents today for a complete physical exam.  Discussed the use of AI scribe software for clinical note transcription with the patient, who gave verbal consent to proceed.  History of Present Illness Diamond Ellis is a 48 year old female who presents for a follow-up visit regarding smoking cessation and hormone therapy management.  Nicotine dependence and smoking cessation - Smoke-free for over one week after a previous 21-day period of abstinence followed by relapse - Uses Nicorette gum to manage cravings, which are severe but short-lived, typically resolving within two minutes with distraction (e.g., washing dishes) - Previously used Chantix  but is not currently taking it  Anxiety and palpitations - Experiences anxiety, particularly related to work stress - Uses Xanax  as needed, approximately one pill per week, primarily in the evenings when feeling overwhelmed or experiencing heart palpitations  Hormone therapy and menopausal symptoms - On estradiol  and progesterone  therapy but frequently forgets to take doses - Experiences hot flashes and other symptoms when estradiol  effects wear off - History of hysterectomy - Significant vaginal dryness  Stimulant medication use - Prescribed Vyvanse , believed to be at a dose of 10 mg, for the past six to seven years - Vyvanse  is not currently listed on her prescription list  Gastrointestinal changes - Irregular bowel movements since quitting smoking  Cutaneous findings - Increase in milia, particularly on the cheek and behind the ears - History of milia removal by dermatologist with recurrence    Most recent fall risk assessment:    05/12/2023    1:02 PM  Fall Risk   Falls in the past year? 0  Number falls in  past yr: 0  Injury with Fall? 0  Risk for fall due to : No Fall Risks  Follow up Falls evaluation completed     Most recent depression screenings:    03/07/2024    1:21 PM 05/12/2023    1:02 PM  PHQ 2/9 Scores  PHQ - 2 Score 2 0  PHQ- 9 Score 17 7    {VISON DENTAL STD PSA (Optional):27386}  {History (Optional):23778}  Patient Care Team: Marquee Fuchs L, PA-C as PCP - General (Family Medicine)   Outpatient Medications Prior to Visit  Medication Sig   ALPRAZolam  (XANAX ) 0.5 MG tablet Take 1 tablet (0.5 mg total) by mouth 2 (two) times daily as needed for anxiety.   EPINEPHrine 0.3 mg/0.3 mL IJ SOAJ injection Inject 0.3 mg into the muscle as needed.   estradiol  (ESTRACE ) 1 MG tablet Take 1 tablet (1 mg total) by mouth daily.   fexofenadine  (ALLEGRA  ALLERGY) 180 MG tablet Take 1 tablet (180 mg total) by mouth daily for 15 days.   ondansetron  (ZOFRAN -ODT) 8 MG disintegrating tablet Take 1 tablet (8 mg total) by mouth every 8 (eight) hours as needed.   Pitavastatin  Calcium  4 MG TABS Take 1 tablet (4 mg total) by mouth daily.   progesterone  (PROMETRIUM ) 100 MG capsule Take 1 capsule (100 mg total) by mouth daily.   typhoid (VIVOTIF) DR capsule Take 1 capsule by mouth every other day.   varenicline  (CHANTIX ) 1 MG tablet TAKE 1 TABLET BY MOUTH TWICE A DAY   Varenicline  Tartrate, Starter, (CHANTIX  STARTING MONTH PAK) 0.5 MG X  11 & 1 MG X 42 TBPK Take one 0.5 mg tablet by mouth once daily for 3 days, then increase to one 0.5 mg tablet twice daily for 4 days, then increase to one 1 mg tablet twice daily.   No facility-administered medications prior to visit.    ROS        Objective:     BP 109/65 (BP Location: Right Arm, Patient Position: Sitting, Cuff Size: Normal)   Pulse 85   Ht 5' 3 (1.6 m)   Wt 141 lb (64 kg)   SpO2 98%   BMI 24.98 kg/m  BP Readings from Last 3 Encounters:  06/22/24 109/65  03/07/24 135/87  01/12/24 109/74   Wt Readings from Last 3 Encounters:   06/22/24 141 lb (64 kg)  03/07/24 141 lb (64 kg)  12/05/23 141 lb 12.8 oz (64.3 kg)      Physical Exam      Assessment & Plan:    Routine Health Maintenance and Physical Exam  Immunization History  Administered Date(s) Administered   Hep A / Hep B 03/07/2024   Hep B, Unspecified 09/27/2013   Hepatitis A, Adult 09/27/2013   Hepatitis B 09/27/2013   Influenza-Unspecified 10/29/2007, 09/27/2013   MMR 09/27/2008   Meningococcal Acwy, Unspecified 01/26/2008   Meningococcal Conjugate 01/26/2008   PFIZER(Purple Top)SARS-COV-2 Vaccination 12/20/2019, 01/10/2020   Pneumococcal-Unspecified 11/26/2007   Tdap 01/26/2008, 03/20/2016, 03/07/2024   Varicella 11/26/2007    Health Maintenance  Topic Date Due   Influenza Vaccine  12/25/2024 (Originally 04/27/2024)   Hepatitis B Vaccines 19-59 Average Risk (3 of 3 - 19+ 3-dose series) 06/22/2025 (Originally 05/02/2024)   Mammogram  12/20/2024   Colonoscopy  02/14/2030   DTaP/Tdap/Td (4 - Td or Tdap) 03/07/2034   Hepatitis C Screening  Completed   HIV Screening  Completed   HPV VACCINES  Aged Out   Meningococcal B Vaccine  Aged Out   Pneumococcal Vaccine  Discontinued   COVID-19 Vaccine  Discontinued    Discussed health benefits of physical activity, and encouraged her to engage in regular exercise appropriate for her age and condition.  Assessment and Plan Assessment & Plan Adult Wellness Visit Routine wellness visit. A1c excellent at 5.2. Bowel movements irregular post-smoking cessation, expected to normalize. - Document BMI in the chart.  Nicotine dependence, currently abstinent Abstinent from smoking for over a week. Using Nicorette gum for cravings. Previously used Chantix , recommended for smoking cessation. - Consider resuming Chantix  for three months. - Continue using Nicorette gum as needed.  Anxiety disorder Anxiety related to work stress, leading to occasional heart palpitations. Xanax  used approximately once a week. -  Refill Xanax  prescription.  Attention-deficit hyperactivity disorder (ADHD) Currently on Vyvanse  10 mg, not listed in prescription records. Used for past six to seven years. - Ensure Vyvanse  is added to the prescription list.  Menopausal symptoms (vasomotor and vaginal) Experiencing vasomotor symptoms and vaginal dryness. No uterus, allowing estrogen-only therapy. Considering switch from estradiol  pills to patch. - Prescribe estradiol  patch. - Prescribe vaginal estrogen cream for dryness, nightly initially, then reduce to three times a week.  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 Previous removal followed by recurrence. Retin A recommended to manage sebaceous oil and reduce pore size. - Prescribe Retin A for nighttime use.       Donevin Sainsbury, PA-C

## 2024-06-24 ENCOUNTER — Encounter: Payer: Self-pay | Admitting: Physician Assistant

## 2024-06-25 ENCOUNTER — Encounter: Payer: Self-pay | Admitting: Physician Assistant

## 2024-06-25 DIAGNOSIS — Z87891 Personal history of nicotine dependence: Secondary | ICD-10-CM | POA: Insufficient documentation

## 2024-06-25 DIAGNOSIS — Z789 Other specified health status: Secondary | ICD-10-CM | POA: Insufficient documentation

## 2024-06-25 DIAGNOSIS — N951 Menopausal and female climacteric states: Secondary | ICD-10-CM | POA: Insufficient documentation

## 2024-06-25 DIAGNOSIS — L720 Epidermal cyst: Secondary | ICD-10-CM | POA: Insufficient documentation

## 2024-06-25 DIAGNOSIS — Z780 Asymptomatic menopausal state: Secondary | ICD-10-CM | POA: Insufficient documentation

## 2024-06-25 MED ORDER — TRETINOIN 0.1 % EX CREA: TOPICAL_CREAM | Freq: Every day | CUTANEOUS | 1 refills | Status: AC

## 2024-06-25 NOTE — Patient Instructions (Signed)

## 2024-07-27 ENCOUNTER — Ambulatory Visit: Payer: Self-pay

## 2024-07-27 NOTE — Telephone Encounter (Signed)
 FYI Only or Action Required?: FYI only for provider: appointment scheduled on 07/30/24.  Patient was last seen in primary care on 06/22/2024 by Antoniette Vermell CROME, PA-C.  Called Nurse Triage reporting Cold Extremity and Numbness.  Symptoms began several weeks ago.  Interventions attempted: Nothing.  Symptoms are: unchanged.  Triage Disposition: See Physician Within 24 Hours  Patient/caregiver understands and will follow disposition?: Yes     Copied from CRM #[id number]. Topic: Clinical - Red Word Triage >> Jul 27, 2024  1:08 PM Larissa S wrote: [facility] that prompted transfer to Nurse Triage: cold sensation with numbness in Lt leg- 2 weeks Reason for Disposition  Numbness in a leg or foot (i.e., loss of sensation)  Answer Assessment - Initial Assessment Questions Pt  states that she has been having this cold sensation in her leg. Her leg is not cold to touch, no color change, no pain. She states she feels it more on the front side of her leg but it's from her groin area to her knee no further. She states she did recently quit smoking.   1. ONSET: When did the pain start?      No pain, cold sensation about 2 weeks 2. LOCATION: Where is the pain located?      Right upper leg 3. PAIN: How bad is the pain?    (Scale 1-10; or mild, moderate, severe)     0  6. OTHER SYMPTOMS: Do you have any other symptoms? (e.g., chest pain, back pain, breathing difficulty, swelling, rash, fever, numbness, weakness)     Numbness feeling at times  Protocols used: Leg Pain-A-AH

## 2024-07-30 ENCOUNTER — Ambulatory Visit (INDEPENDENT_AMBULATORY_CARE_PROVIDER_SITE_OTHER): Admitting: Physician Assistant

## 2024-07-30 ENCOUNTER — Encounter: Payer: Self-pay | Admitting: Physician Assistant

## 2024-07-30 VITALS — BP 112/70 | HR 76 | Ht 63.0 in | Wt 146.0 lb

## 2024-07-30 DIAGNOSIS — R635 Abnormal weight gain: Secondary | ICD-10-CM | POA: Insufficient documentation

## 2024-07-30 DIAGNOSIS — R202 Paresthesia of skin: Secondary | ICD-10-CM

## 2024-07-30 DIAGNOSIS — Z8249 Family history of ischemic heart disease and other diseases of the circulatory system: Secondary | ICD-10-CM | POA: Insufficient documentation

## 2024-07-30 DIAGNOSIS — M5136 Other intervertebral disc degeneration, lumbar region with discogenic back pain only: Secondary | ICD-10-CM | POA: Insufficient documentation

## 2024-07-30 DIAGNOSIS — R200 Anesthesia of skin: Secondary | ICD-10-CM | POA: Diagnosis not present

## 2024-07-30 DIAGNOSIS — Z87891 Personal history of nicotine dependence: Secondary | ICD-10-CM | POA: Diagnosis not present

## 2024-07-30 MED ORDER — PREDNISONE 50 MG PO TABS: ORAL_TABLET | ORAL | 0 refills | Status: DC

## 2024-07-30 NOTE — Patient Instructions (Signed)
 Start exercises Wear loose fitting clothes Start prednisone  Will order EMGs

## 2024-07-30 NOTE — Progress Notes (Signed)
 Acute Office Visit  Subjective:     Patient ID: Diamond Ellis, female    DOB: Feb 06, 1976, 48 y.o.   MRN: [id number]  Chief Complaint  Patient presents with   Numbness    Left thigh numbness onset 2 weeks    HPI .SABRADiscussed the use of AI scribe software for clinical note transcription with the patient, who gave verbal consent to proceed.  History of Present Illness Diamond Ellis is a 48 year old female who presents with left leg numbness and cold sensation of her upper left thigh.   Left lower extremity sensory changes - Sudden onset of numbness and cold sensation in the left leg approximately two weeks ago - Symptoms initially localized, now extending down to the calf - Cold sensation is persistent - no injury/surgery - pt is very active - Pt is worried about blood clot due to her grandfather having a blood clot - Pt is on HRT  Chronic back pain and sciatic symptoms - History of two bulging discs diagnosed ten years ago - Previous symptoms included significant back pain and sciatic nerve involvement with difficulty lifting the leg - Physical therapy and weight loss previously alleviated symptoms - Currently experiences constant back pain without localized tenderness  Recent smoking cessation and associated symptoms - Quit smoking two months ago - Since cessation, has experienced joint pain - Noticed weight gain despite unchanged appetite and eating habits - Attributes weight gain to metabolic changes after quitting smoking - Increased physical activity but remains concerned about weight gain  Appetite and stimulant use - Takes Vyvanse  as needed, approximately three times per week - Vyvanse  helps curb appetite   ROS See HPI.      Objective:    BP 112/70   Pulse 76   Ht 5' 3 (1.6 m)   Wt 146 lb (66.2 kg)   SpO2 98%   BMI 25.86 kg/m  BP Readings from Last 3 Encounters:  07/30/24 112/70  06/22/24 109/65  03/07/24 135/87   Wt Readings from Last 3  Encounters:  07/30/24 146 lb (66.2 kg)  06/22/24 141 lb (64 kg)  03/07/24 141 lb (64 kg)      Physical Exam Constitutional:      Appearance: Normal appearance.  HENT:     Head: Normocephalic.  Cardiovascular:     Rate and Rhythm: Normal rate and regular rhythm.  Pulmonary:     Effort: Pulmonary effort is normal.     Breath sounds: Normal breath sounds.  Musculoskeletal:     Right lower leg: No edema.     Left lower leg: No edema.     Comments: Thigh size left and right 50 and 1/2 inches.   No pain to palpation over lumbar spine and greater trochanter of left hip.   5/5 strength of lower leg.   Negative SLR, bilaterally.   Negative homans sign, left foot.   Neurological:     General: No focal deficit present.     Mental Status: She is alert.  Psychiatric:        Mood and Affect: Mood normal.          Assessment & Plan:  SABRASABRAAmy Terrianna Jasinski was seen today for numbness.  Diagnoses and all orders for this visit:  Numbness and tingling of left leg -     D-dimer, quantitative -     CMP14+EGFR -     CBC w/Diff/Platelet -     Fe+TIBC+Fer -  Sed Rate (ESR) -     C-reactive protein -     predniSONE  (DELTASONE ) 50 MG tablet; Take one tablet daily for 5 days. 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 Low suspicion for deep vein thrombosis. - Order EMG to assess nerve function. - Prescribe prednisone  50 mg daily for five days. - Provide exercises for nerve release. - Order D-dimer test; if positive, proceed with ultrasound. No signs of swelling, warmth, or redness on exam today. She is on HRT which is a risk factor for DVT. Vitals look great.  Normal HR.  - Initiate formal physical therapy if no improvement.  Abnormal weight gain Weight gain despite increased activity may be due to metabolic changes post-smoking cessation. - Encourage daily use of Vyvanse  to curb appetite.  Nicotine dependence, in remission Smoke-free for two months with possible cessation-related symptoms.    Boomer Winders, PA-C

## 2024-07-31 ENCOUNTER — Ambulatory Visit: Payer: Self-pay | Admitting: Physician Assistant

## 2024-07-31 LAB — CMP14+EGFR
ALT: 14 IU/L (ref 0–32)
AST: 19 IU/L (ref 0–40)
Albumin: 4.1 g/dL (ref 3.9–4.9)
Alkaline Phosphatase: 79 IU/L (ref 41–116)
BUN/Creatinine Ratio: 14 (ref 9–23)
BUN: 11 mg/dL (ref 6–24)
Bilirubin Total: 0.3 mg/dL (ref 0.0–1.2)
CO2: 23 mmol/L (ref 20–29)
Calcium: 8.9 mg/dL (ref 8.7–10.2)
Chloride: 105 mmol/L (ref 96–106)
Creatinine, Ser: 0.79 mg/dL (ref 0.57–1.00)
Globulin, Total: 1.6 g/dL (ref 1.5–4.5)
Glucose: 88 mg/dL (ref 70–99)
Potassium: 4.4 mmol/L (ref 3.5–5.2)
Sodium: 141 mmol/L (ref 134–144)
Total Protein: 5.7 g/dL — ABNORMAL LOW (ref 6.0–8.5)
eGFR: 92 mL/min/1.73 (ref >59)

## 2024-07-31 LAB — CBC WITH DIFFERENTIAL/PLATELET
Basophils Absolute: 0.1 x10E3/uL (ref 0.0–0.2)
Basos: 1 %
EOS (ABSOLUTE): 0.1 x10E3/uL (ref 0.0–0.4)
Eos: 2 %
Hematocrit: 36.6 % (ref 34.0–46.6)
Hemoglobin: 12.2 g/dL (ref 11.1–15.9)
Immature Grans (Abs): 0 x10E3/uL (ref 0.0–0.1)
Immature Granulocytes: 0 %
Lymphocytes Absolute: 1.7 x10E3/uL (ref 0.7–3.1)
Lymphs: 35 %
MCH: 30.8 pg (ref 26.6–33.0)
MCHC: 33.3 g/dL (ref 31.5–35.7)
MCV: 92 fL (ref 79–97)
Monocytes Absolute: 0.4 x10E3/uL (ref 0.1–0.9)
Monocytes: 8 %
Neutrophils Absolute: 2.7 x10E3/uL (ref 1.4–7.0)
Neutrophils: 54 %
Platelets: 264 x10E3/uL (ref 150–450)
RBC: 3.96 x10E6/uL (ref 3.77–5.28)
RDW: 12 % (ref 11.7–15.4)
WBC: 5 x10E3/uL (ref 3.4–10.8)

## 2024-07-31 LAB — IRON,TIBC AND FERRITIN PANEL
Ferritin: 83 ng/mL (ref 15–150)
Iron Saturation: 21 % (ref 15–55)
Iron: 62 ug/dL (ref 27–159)
Total Iron Binding Capacity: 289 ug/dL (ref 250–450)
UIBC: 227 ug/dL (ref 131–425)

## 2024-07-31 LAB — D-DIMER, QUANTITATIVE: D-DIMER: 0.25 mg{FEU}/L (ref 0.00–0.49)

## 2024-07-31 LAB — SEDIMENTATION RATE: Sed Rate: 9 mm/h (ref 0–32)

## 2024-07-31 LAB — C-REACTIVE PROTEIN: CRP: 1 mg/L (ref 0–10)

## 2024-07-31 NOTE — Progress Notes (Signed)
 Diamond Ellis,  Normal D-Dimer. GREAT news.  Increase protein in diet! Protein in blood is low.  Inflammatory markers are low.  Kidney, liver, glucose look good.

## 2024-10-04 ENCOUNTER — Ambulatory Visit
Admission: RE | Admit: 2024-10-04 | Discharge: 2024-10-04 | Disposition: A | Discharge status: Home / Self Care | Attending: Family Medicine | Admitting: Family Medicine

## 2024-10-04 VITALS — BP 110/76 | HR 101 | Temp 99.7°F | Resp 18 | Ht 64.0 in | Wt 142.0 lb

## 2024-10-04 DIAGNOSIS — J0100 Acute maxillary sinusitis, unspecified: Secondary | ICD-10-CM

## 2024-10-04 DIAGNOSIS — R509 Fever, unspecified: Secondary | ICD-10-CM | POA: Diagnosis not present

## 2024-10-04 DIAGNOSIS — J029 Acute pharyngitis, unspecified: Secondary | ICD-10-CM

## 2024-10-04 LAB — POC SOFIA SARS ANTIGEN FIA: SARS Coronavirus 2 Ag: NEGATIVE

## 2024-10-04 LAB — POCT INFLUENZA A/B
Influenza A, POC: NEGATIVE
Influenza B, POC: NEGATIVE

## 2024-10-04 LAB — POCT RAPID STREP A (OFFICE): Rapid Strep A Screen: NEGATIVE

## 2024-10-04 MED ORDER — SULFAMETHOXAZOLE-TRIMETHOPRIM 800-160 MG PO TABS: 1.0000 | ORAL_TABLET | Freq: Two times a day (BID) | ORAL | 0 refills | Status: AC

## 2024-10-04 MED ORDER — PREDNISONE 20 MG PO TABS: ORAL_TABLET | ORAL | 0 refills | Status: DC
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
## 2024-10-04 NOTE — ED Triage Notes (Signed)
 Patient states that she started w/sore throat last night, this am it felt like she was chewing on glass.  Patient has been exposed to strep, flu and covid.  Low grade fever.  Patient has taken Advil.

## 2024-10-04 NOTE — Discharge Instructions (Addendum)
 Advised patient take medications as directed with food to completion.  Advised take prednisone  with first dose of Bactrim  for the next 5 of 7 days.  Advised patient may take OTC Tylenol  1000 mg every 6 hours for fever (oral temperature greater than 100.3).  Encouraged increase daily water intake to 64 ounces per day while taking these medications.  Advised if symptoms worsen and are unresolved please follow-up with PCP or here for further evaluation

## 2024-10-09 ENCOUNTER — Telehealth: Payer: Self-pay

## 2024-10-11 ENCOUNTER — Ambulatory Visit: Payer: Self-pay

## 2024-10-11 NOTE — Telephone Encounter (Signed)
 Per the covering provider's request, tried contacting the patient for scheduling. No answer, requested for the patient to return a call back in the morning to be scheduled with Jade for a re-evaluation of her current symptoms. The patient was notified that the 850 time slot will be held in the meantime. Direct call back information provided.

## 2024-10-11 NOTE — Telephone Encounter (Signed)
 FYI Only or Action Required?: Action required by provider: request for appointment, clinical question for provider, and update on patient condition.  Patient was last seen in primary care on 07/30/2024 by Antoniette Vermell CROME, PA-C.  Called Nurse Triage reporting Chest Pain.  Symptoms began several weeks ago.  Interventions attempted: OTC medications: advil, Prescription medications: full course bactrim  and prednisone , Rest, hydration, or home remedies, and Other: UC.  Symptoms are: gradually worsening.  Triage Disposition: Go to ED Now (Notify PCP)  Patient/caregiver understands and will follow disposition?: No, refuses disposition        Copied from CRM [id number]. Topic: Clinical - Red Word Triage >> Oct 11, 2024  2:59 PM Diamond Ellis wrote: [facility] that prompted transfer to Nurse Triage: chest tight/ lungs feel congested, large amount of mucus.. coughing up substantial amount and coming from nose.. worsening.. finished antibiotics from urgent care.. Reason for Disposition  Difficulty breathing  Answer Assessment - Initial Assessment Questions This RN recommended pt be examined in hospital, pt refusing. Advised pt call 911 or get to hospital asap if any new or worsening symptoms. Sending message to PCP office for call back to pt with further recommendations. Alerted [facility] to ED refusal.     Pt having these symptoms with some worsening despite completing 7-day course of antibx and prednisone  from UC. Pt states she has allergic/adverse reactions to so many antibx but okay with sulfa  drugs so put on bactrim , may not have been strong enough per pt.  4. PATTERN: Does the pain come and go, or has it been constant since it started?  Does it get worse with exertion?      Comes and goes, with SOB with moving around  6. SEVERITY: How bad is the pain?  (e.Ellis., Scale 1-10; mild, moderate, or severe)     Discomfort, chest tightness Feels very comparable to when had pneumonia before  7.  CARDIAC RISK FACTORS: Do you have any history of heart problems or risk factors for heart disease? (e.Ellis., angina, prior heart attack; diabetes, high blood pressure, high cholesterol, smoker, or strong family history of heart disease)     Significant  8. PULMONARY RISK FACTORS: Do you have any history of lung disease?  (e.Ellis., blood clots in lung, asthma, emphysema, birth control pills)     Pt denies asthma, COPD, or hx blood clots in legs or lungs Emphysema per pt chart, smoked for 25 years, quit smoking 6 months ago  9. CAUSE: What do you think is causing the chest pain?     Thinks getting pneumonia  10. OTHER SYMPTOMS: Do you have any other symptoms? (e.Ellis., dizziness, nausea, vomiting, sweating, fever, difficulty breathing, cough)       Chest congestion and nasal congestion amount of mucus..is unbelievable Right ear drum was perforated Still feel quite bit of congestion in ear Started today with teeth hurting A little woozy/lightheaded if not cleared all congestion out of chest HR 98 bpm right now Fever for her 100 F (never have high temp, 100 F even when extremely sick per pt) intermittent for 7-8 days  Denies: SOB at rest Wheezing stridor Inhalers  Protocols used: Chest Pain-A-AH

## 2024-10-12 ENCOUNTER — Ambulatory Visit

## 2024-10-12 ENCOUNTER — Encounter: Payer: Self-pay | Admitting: Urgent Care

## 2024-10-12 ENCOUNTER — Ambulatory Visit: Payer: Self-pay | Admitting: Urgent Care

## 2024-10-12 ENCOUNTER — Ambulatory Visit (INDEPENDENT_AMBULATORY_CARE_PROVIDER_SITE_OTHER): Admitting: Urgent Care

## 2024-10-12 VITALS — BP 120/80 | HR 89 | Temp 97.5°F | Ht 63.0 in | Wt 145.0 lb

## 2024-10-12 DIAGNOSIS — R051 Acute cough: Secondary | ICD-10-CM

## 2024-10-12 DIAGNOSIS — R509 Fever, unspecified: Secondary | ICD-10-CM

## 2024-10-12 DIAGNOSIS — J22 Unspecified acute lower respiratory infection: Secondary | ICD-10-CM | POA: Diagnosis not present

## 2024-10-12 MED ORDER — DOXYCYCLINE HYCLATE 100 MG PO TABS: 100.0000 mg | ORAL_TABLET | Freq: Two times a day (BID) | ORAL | 0 refills | Status: AC
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
## 2024-10-12 NOTE — Telephone Encounter (Signed)
 Noted! Thank you

## 2024-10-12 NOTE — Telephone Encounter (Signed)
 Attempted call to patient. Left a voice mail message that the 8:50 slot that was held is no longer available as provider will not be in the office today - requesting a return call and left on message our main # for return contact.

## 2024-10-12 NOTE — Patient Instructions (Signed)
 Your xray is negative for pneumonia.  Please take doxycycline  twice daily, 1hr before or 2 hrs after meals for best absorption. Do not take multivitamins or iron supplements while on this antibiotic.  If you continue to have symptoms despite the antibiotic going into next week, please contact our office and we will add Ventolin inhaler and oral prednisone .  Stay hydrated with water. Spit out what you cough up. Take mucinex 600mg  twice daily.

## 2024-10-12 NOTE — Telephone Encounter (Signed)
 Contacted patient, scheduled an appointment for today 10/12/2024 at 10:40am/tli
